# Patient Record
Sex: Female | Born: 1995 | Race: Black or African American | Hispanic: No | Marital: Single | Smoking: Current some day smoker
Health system: Southern US, Community
[De-identification: ages and names within clinical notes are randomized; demographics above are authoritative.]

## PROBLEM LIST (undated history)

## (undated) ENCOUNTER — Inpatient Hospital Stay (HOSPITAL_COMMUNITY): Payer: Self-pay

## (undated) HISTORY — PX: WISDOM TOOTH EXTRACTION: SHX21

---
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
## 2016-08-15 ENCOUNTER — Ambulatory Visit (HOSPITAL_COMMUNITY)
Admission: EM | Admit: 2016-08-15 | Discharge: 2016-08-15 | Disposition: A | Discharge status: Home / Self Care | Payer: Self-pay | Attending: Internal Medicine | Admitting: Internal Medicine

## 2016-08-15 ENCOUNTER — Encounter (HOSPITAL_COMMUNITY): Payer: Self-pay | Admitting: Emergency Medicine

## 2016-08-15 DIAGNOSIS — Z3202 Encounter for pregnancy test, result negative: Secondary | ICD-10-CM

## 2016-08-15 DIAGNOSIS — R1032 Left lower quadrant pain: Secondary | ICD-10-CM

## 2016-08-15 DIAGNOSIS — R1012 Left upper quadrant pain: Secondary | ICD-10-CM

## 2016-08-15 DIAGNOSIS — R109 Unspecified abdominal pain: Secondary | ICD-10-CM

## 2016-08-15 LAB — POCT URINALYSIS DIP (DEVICE)
BILIRUBIN URINE: NEGATIVE
Glucose, UA: NEGATIVE mg/dL
Hgb urine dipstick: NEGATIVE
Ketones, ur: NEGATIVE mg/dL
Leukocytes, UA: NEGATIVE
NITRITE: NEGATIVE
PH: 7 (ref 5.0–8.0)
PROTEIN: NEGATIVE mg/dL
Specific Gravity, Urine: 1.02 (ref 1.005–1.030)
UROBILINOGEN UA: 0.2 mg/dL (ref 0.0–1.0)

## 2016-08-15 LAB — POCT PREGNANCY, URINE: PREG TEST UR: NEGATIVE

## 2016-08-15 MED ORDER — DOCUSATE SODIUM 50 MG PO CAPS: 50.0000 mg | ORAL_CAPSULE | Freq: Two times a day (BID) | ORAL | 0 refills | Status: DC

## 2016-08-15 MED ORDER — POLYETHYLENE GLYCOL 3350 17 G PO PACK: 17.0000 g | PACK | Freq: Every day | ORAL | 0 refills | Status: DC

## 2016-08-15 NOTE — ED Triage Notes (Signed)
Pt here for constant abd pain onset 2 days that is getting worse associated w/nausea  Also reports induced vomiting to see if she would feel better.   Denies fevers  A&O x4... NAD.Marland Kitchen. ambulatory

## 2016-08-15 NOTE — Discharge Instructions (Signed)
Your urine sample was negative for blood or infection. Given your history and exam, abdominal pain can be due to constipation/stool impaction. Start Colace and Miralax. Currently there is no signs of infection, but monitor for worsening abdominal pain, fever, N/V. If symptoms are not relieved by clearing the bowel, will need reevaluation.

## 2016-08-15 NOTE — ED Provider Notes (Signed)
CSN: [id number]     Arrival date & time 08/15/16  1255 History   None    Chief Complaint  Patient presents with  . Abdominal Pain   (Consider location/radiation/quality/duration/timing/severity/associated sxs/prior Treatment) HPI 21 yo female comes in with 2 day history of constant abdominal pain with nausea. She has not been able to associate pain with food or movement. Patient states induced 1 episode of vomiting without relief. She had 1 BM recently with hard stool. Has history of hard BM. Denies fever, chills, night sweats. Denies blood in stool. Denies urinary frequency, dysuria, hematuria. Has IUD and does not get a period. Not currently sexually active. Denies vaginal discharge, pain/itching, spotting.   Past Medical History:  Diagnosis Date  . Morbid obesity (HCC)    Past Surgical History:  Procedure Laterality Date  . WISDOM TOOTH EXTRACTION     Family History  Problem Relation Age of Onset  . Hypertension Father   . Heart disease Father    Social History  Substance Use Topics  . Smoking status: Never Smoker  . Smokeless tobacco: Never Used  . Alcohol use No   OB History    Gravida Para Term Preterm AB Living   1             SAB TAB Ectopic Multiple Live Births                 Review of Systems  Constitutional: Negative for chills, diaphoresis and fever.  Respiratory: Negative for cough, shortness of breath and wheezing.   Cardiovascular: Negative for chest pain and palpitations.  Gastrointestinal: Positive for abdominal pain, constipation, nausea and vomiting (self induced). Negative for blood in stool and diarrhea.  Genitourinary: Negative for decreased urine volume, difficulty urinating, dysuria, flank pain, frequency, hematuria, menstrual problem, pelvic pain, urgency, vaginal bleeding, vaginal discharge and vaginal pain.  Skin: Negative for rash.    Allergies  Bee venom  Home Medications   Prior to Admission medications   Medication Sig Start Date End  Date Taking? Authorizing Provider  levonorgestrel (MIRENA) 20 MCG/24HR IUD 1 each by Intrauterine route once.   Yes [provider]  docusate sodium (COLACE) 50 MG capsule Take 1 capsule (50 mg total) by mouth 2 (two) times daily. 08/15/16   Belinda Fisher, PA-C  HYDROcodone-acetaminophen (NORCO/VICODIN) 5-325 MG per tablet 1 or 2 tabs PO q6 hours prn pain 09/19/13   Samuel Jester, DO  methocarbamol (ROBAXIN) 500 MG tablet Take 2 tablets (1,000 mg total) by mouth 4 (four) times daily as needed for muscle spasms (muscle spasm/pain). 09/19/13   Samuel Jester, DO  naproxen (NAPROSYN) 250 MG tablet Take 1 tablet (250 mg total) by mouth 2 (two) times daily with a meal. 09/19/13   Samuel Jester, DO  polyethylene glycol [facility]) packet Take 17 g by mouth daily. 08/15/16   Belinda Fisher, PA-C   Meds Ordered and Administered this Visit  Medications - No data to display  BP (!) 143/113 (BP Location: Right Arm)   Pulse 89   Temp 97.9 F (36.6 C) (Oral)   Resp 20   SpO2 97%  No data found.   Physical Exam  Constitutional: She is oriented to person, place, and time. She appears well-developed and well-nourished. No distress.  HENT:  Head: Normocephalic and atraumatic.  Eyes: Conjunctivae are normal. Pupils are equal, round, and reactive to light.  Cardiovascular: Normal rate and regular rhythm.  Exam reveals no gallop and no friction rub.   No  murmur heard. Pulmonary/Chest: Effort normal and breath sounds normal. She has no wheezes. She has no rales.  Abdominal: Soft. Bowel sounds are normal. She exhibits distension. There is tenderness (at LUQ and LLQ. No guarding or rebound.Marland Kitchen ).  Neurological: She is alert and oriented to person, place, and time.  Skin: Skin is warm and dry.  Psychiatric: She has a normal mood and affect. Her behavior is normal. Judgment normal.    Urgent Care Course     Procedures (including critical care time)  Labs Review Labs Reviewed  POCT URINALYSIS DIP  (DEVICE)  POCT PREGNANCY, URINE    Imaging Review No results found.      MDM   1. Abdominal pain, left lower quadrant   2. Abdominal pain, left upper quadrant    1. Discussed with patient and mother, urine sample was negative for infection or blood. Currently no signs of infection, fever, rebound. History and exam most consistent with constipation and stool impaction, discussed options of treating as constipation or to get abdominal xray. Patient would like to treat as stool impaction, and will follow up with women's health to rule out other causes. Give history and exam of acute abdomen and without history of similar symptoms, low suspicion for ovarian torsion. Patient to monitor for fever, increasing pain, vomiting not self induced to go to the ED for evaluation.    Belinda Fisher, PA-C 08/15/16 1414

## 2021-12-14 ENCOUNTER — Emergency Department (HOSPITAL_COMMUNITY)
Admission: EM | Admit: 2021-12-14 | Discharge: 2021-12-14 | Discharge status: Against Medical Advice | Payer: Medicaid Other | Attending: Emergency Medicine | Admitting: Emergency Medicine

## 2021-12-14 ENCOUNTER — Other Ambulatory Visit: Payer: Self-pay

## 2021-12-14 ENCOUNTER — Inpatient Hospital Stay (HOSPITAL_BASED_OUTPATIENT_CLINIC_OR_DEPARTMENT_OTHER)
Admission: EM | Admit: 2021-12-14 | Discharge: 2021-12-17 | DRG: 175 | Disposition: A | Discharge status: Home / Self Care | Payer: Self-pay | Attending: Family Medicine | Admitting: Family Medicine

## 2021-12-14 ENCOUNTER — Encounter (HOSPITAL_COMMUNITY): Payer: Self-pay | Admitting: *Deleted

## 2021-12-14 ENCOUNTER — Encounter (HOSPITAL_COMMUNITY): Payer: Self-pay

## 2021-12-14 ENCOUNTER — Emergency Department (HOSPITAL_BASED_OUTPATIENT_CLINIC_OR_DEPARTMENT_OTHER): Payer: Medicaid Other

## 2021-12-14 ENCOUNTER — Emergency Department (HOSPITAL_COMMUNITY): Payer: Medicaid Other

## 2021-12-14 ENCOUNTER — Encounter (HOSPITAL_BASED_OUTPATIENT_CLINIC_OR_DEPARTMENT_OTHER): Payer: Self-pay | Admitting: Emergency Medicine

## 2021-12-14 DIAGNOSIS — R059 Cough, unspecified: Secondary | ICD-10-CM | POA: Insufficient documentation

## 2021-12-14 DIAGNOSIS — Z5321 Procedure and treatment not carried out due to patient leaving prior to being seen by health care provider: Secondary | ICD-10-CM | POA: Insufficient documentation

## 2021-12-14 DIAGNOSIS — R17 Unspecified jaundice: Secondary | ICD-10-CM | POA: Diagnosis present

## 2021-12-14 DIAGNOSIS — F1210 Cannabis abuse, uncomplicated: Secondary | ICD-10-CM | POA: Diagnosis present

## 2021-12-14 DIAGNOSIS — Z79899 Other long term (current) drug therapy: Secondary | ICD-10-CM

## 2021-12-14 DIAGNOSIS — R0602 Shortness of breath: Secondary | ICD-10-CM | POA: Insufficient documentation

## 2021-12-14 DIAGNOSIS — I5020 Unspecified systolic (congestive) heart failure: Secondary | ICD-10-CM

## 2021-12-14 DIAGNOSIS — I2694 Multiple subsegmental pulmonary emboli without acute cor pulmonale: Principal | ICD-10-CM

## 2021-12-14 DIAGNOSIS — Z20822 Contact with and (suspected) exposure to covid-19: Secondary | ICD-10-CM | POA: Insufficient documentation

## 2021-12-14 DIAGNOSIS — J189 Pneumonia, unspecified organism: Secondary | ICD-10-CM | POA: Diagnosis present

## 2021-12-14 DIAGNOSIS — J029 Acute pharyngitis, unspecified: Secondary | ICD-10-CM | POA: Insufficient documentation

## 2021-12-14 DIAGNOSIS — I2489 Other forms of acute ischemic heart disease: Secondary | ICD-10-CM | POA: Diagnosis present

## 2021-12-14 DIAGNOSIS — R9431 Abnormal electrocardiogram [ECG] [EKG]: Secondary | ICD-10-CM

## 2021-12-14 DIAGNOSIS — Z975 Presence of (intrauterine) contraceptive device: Secondary | ICD-10-CM

## 2021-12-14 DIAGNOSIS — I420 Dilated cardiomyopathy: Secondary | ICD-10-CM | POA: Diagnosis present

## 2021-12-14 DIAGNOSIS — Z6841 Body Mass Index (BMI) 40.0 and over, adult: Secondary | ICD-10-CM

## 2021-12-14 DIAGNOSIS — I10 Essential (primary) hypertension: Secondary | ICD-10-CM | POA: Insufficient documentation

## 2021-12-14 DIAGNOSIS — F1810 Inhalant abuse, uncomplicated: Secondary | ICD-10-CM | POA: Diagnosis present

## 2021-12-14 DIAGNOSIS — J9601 Acute respiratory failure with hypoxia: Secondary | ICD-10-CM | POA: Diagnosis present

## 2021-12-14 DIAGNOSIS — D72828 Other elevated white blood cell count: Secondary | ICD-10-CM | POA: Diagnosis not present

## 2021-12-14 DIAGNOSIS — Z1152 Encounter for screening for COVID-19: Secondary | ICD-10-CM

## 2021-12-14 DIAGNOSIS — T380X5A Adverse effect of glucocorticoids and synthetic analogues, initial encounter: Secondary | ICD-10-CM | POA: Diagnosis not present

## 2021-12-14 DIAGNOSIS — I5022 Chronic systolic (congestive) heart failure: Secondary | ICD-10-CM | POA: Diagnosis present

## 2021-12-14 DIAGNOSIS — R0981 Nasal congestion: Secondary | ICD-10-CM | POA: Insufficient documentation

## 2021-12-14 DIAGNOSIS — E872 Acidosis, unspecified: Secondary | ICD-10-CM | POA: Diagnosis present

## 2021-12-14 DIAGNOSIS — I2699 Other pulmonary embolism without acute cor pulmonale: Principal | ICD-10-CM | POA: Diagnosis present

## 2021-12-14 DIAGNOSIS — E876 Hypokalemia: Secondary | ICD-10-CM | POA: Diagnosis present

## 2021-12-14 DIAGNOSIS — Z8249 Family history of ischemic heart disease and other diseases of the circulatory system: Secondary | ICD-10-CM

## 2021-12-14 LAB — HCG, SERUM, QUALITATIVE: Preg, Serum: NEGATIVE

## 2021-12-14 LAB — CBC WITH DIFFERENTIAL/PLATELET
Abs Immature Granulocytes: 0.04 10*3/uL (ref 0.00–0.07)
Basophils Absolute: 0 10*3/uL (ref 0.0–0.1)
Basophils Relative: 0 %
Eosinophils Absolute: 0.1 10*3/uL (ref 0.0–0.5)
Eosinophils Relative: 1 %
HCT: 42.3 % (ref 36.0–46.0)
Hemoglobin: 14 g/dL (ref 12.0–15.0)
Immature Granulocytes: 0 %
Lymphocytes Relative: 33 %
Lymphs Abs: 3.3 10*3/uL (ref 0.7–4.0)
MCH: 29.2 pg (ref 26.0–34.0)
MCHC: 33.1 g/dL (ref 30.0–36.0)
MCV: 88.1 fL (ref 80.0–100.0)
Monocytes Absolute: 0.9 10*3/uL (ref 0.1–1.0)
Monocytes Relative: 9 %
Neutro Abs: 5.7 10*3/uL (ref 1.7–7.7)
Neutrophils Relative %: 57 %
Platelets: 312 10*3/uL (ref 150–400)
RBC: 4.8 MIL/uL (ref 3.87–5.11)
RDW: 12.8 % (ref 11.5–15.5)
WBC: 10 10*3/uL (ref 4.0–10.5)
nRBC: 0 % (ref 0.0–0.2)

## 2021-12-14 LAB — COMPREHENSIVE METABOLIC PANEL
ALT: 37 U/L (ref 0–44)
AST: 31 U/L (ref 15–41)
Albumin: 3.7 g/dL (ref 3.5–5.0)
Alkaline Phosphatase: 61 U/L (ref 38–126)
Anion gap: 9 (ref 5–15)
BUN: 11 mg/dL (ref 6–20)
CO2: 21 mmol/L — ABNORMAL LOW (ref 22–32)
Calcium: 8.6 mg/dL — ABNORMAL LOW (ref 8.9–10.3)
Chloride: 108 mmol/L (ref 98–111)
Creatinine, Ser: 0.99 mg/dL (ref 0.44–1.00)
GFR, Estimated: >60 mL/min (ref >60)
Glucose, Bld: 109 mg/dL — ABNORMAL HIGH (ref 70–99)
Potassium: 3.4 mmol/L — ABNORMAL LOW (ref 3.5–5.1)
Sodium: 138 mmol/L (ref 135–145)
Total Bilirubin: 1.3 mg/dL — ABNORMAL HIGH (ref 0.3–1.2)
Total Protein: 7.3 g/dL (ref 6.5–8.1)

## 2021-12-14 LAB — RESP PANEL BY RT-PCR (FLU A&B, COVID) ARPGX2
Influenza A by PCR: NEGATIVE
Influenza B by PCR: NEGATIVE
SARS Coronavirus 2 by RT PCR: NEGATIVE

## 2021-12-14 LAB — BRAIN NATRIURETIC PEPTIDE: B Natriuretic Peptide: 257 pg/mL — ABNORMAL HIGH (ref 0.0–100.0)

## 2021-12-14 LAB — TROPONIN I (HIGH SENSITIVITY)
Troponin I (High Sensitivity): 33 ng/L — ABNORMAL HIGH (ref <18)
Troponin I (High Sensitivity): 29 ng/L — ABNORMAL HIGH (ref <18)

## 2021-12-14 LAB — GROUP A STREP BY PCR: Group A Strep by PCR: NOT DETECTED

## 2021-12-14 MED ORDER — SODIUM CHLORIDE 0.9 % IV SOLN
2.0000 g | Freq: Once | INTRAVENOUS | Status: AC
  Administered 2021-12-14: 2 g via INTRAVENOUS
  Filled 2021-12-14: qty 20

## 2021-12-14 MED ORDER — HEPARIN BOLUS VIA INFUSION
7000.0000 [IU] | Freq: Once | INTRAVENOUS | Status: AC
  Administered 2021-12-15: 7000 [IU] via INTRAVENOUS

## 2021-12-14 MED ORDER — SODIUM CHLORIDE 0.9 % IV SOLN
500.0000 mg | Freq: Once | INTRAVENOUS | Status: AC
  Administered 2021-12-14: 500 mg via INTRAVENOUS
  Filled 2021-12-14: qty 5

## 2021-12-14 MED ORDER — HEPARIN (PORCINE) 25000 UT/250ML-% IV SOLN
2000.0000 [IU]/h | INTRAVENOUS | Status: DC
  Administered 2021-12-15: 2000 [IU]/h via INTRAVENOUS
  Filled 2021-12-14: qty 250

## 2021-12-14 MED ORDER — IPRATROPIUM-ALBUTEROL 0.5-2.5 (3) MG/3ML IN SOLN
3.0000 mL | Freq: Once | RESPIRATORY_TRACT | Status: AC
  Administered 2021-12-14: 3 mL via RESPIRATORY_TRACT
  Filled 2021-12-14: qty 3

## 2021-12-14 MED ORDER — IOHEXOL 350 MG/ML SOLN
80.0000 mL | Freq: Once | INTRAVENOUS | Status: AC | PRN
  Administered 2021-12-14: 80 mL via INTRAVENOUS

## 2021-12-14 NOTE — ED Notes (Signed)
Patient placed on 2 liters O2 to decrease work of breathing. Patient oxygen saturation is 96%/RR 22. Patient tolerating well.

## 2021-12-14 NOTE — ED Provider Notes (Signed)
[facility] EMERGENCY DEPARTMENT Provider Note   CSN: [id number] Arrival date & time: 12/14/21  2024     History  Chief Complaint  Patient presents with   Shortness of Breath    Rebecca Tapia is a 26 y.o. female.  With history of morbid obesity who presents ED for evaluation of shortness of breath.  She states shortness of breath initially started 10 days ago and has progressively gotten worse.  She states 2 days after the shortness of breath started she developed a cough.  Cough was initially nonproductive, however has progressed to being productive of green sputum.  Patient is short of breath both at rest and with exercise.  She states she cannot walk up a flight of stairs without taking a break which is not normal for her.  No history of blood clots.  Patient does have an estrogen IUD.  No chest pain, dizziness, lightheadedness, hemoptysis, fevers, chills, recent immobilization or surgery, history of blood clots.   Shortness of Breath      Home Medications Prior to Admission medications   Medication Sig Start Date End Date Taking? Authorizing Provider  docusate sodium (COLACE) 50 MG capsule Take 1 capsule (50 mg total) by mouth 2 (two) times daily. 08/15/16   Belinda Fisher, PA-C  HYDROcodone-acetaminophen (NORCO/VICODIN) 5-325 MG per tablet 1 or 2 tabs PO q6 hours prn pain 09/19/13   Samuel Jester, DO  levonorgestrel [facility]) 20 MCG/24HR IUD 1 each by Intrauterine route once.    [provider]  methocarbamol (ROBAXIN) 500 MG tablet Take 2 tablets (1,000 mg total) by mouth 4 (four) times daily as needed for muscle spasms (muscle spasm/pain). 09/19/13   Samuel Jester, DO  naproxen (NAPROSYN) 250 MG tablet Take 1 tablet (250 mg total) by mouth 2 (two) times daily with a meal. 09/19/13   Samuel Jester, DO  polyethylene glycol [facility]) packet Take 17 g by mouth daily. 08/15/16   Belinda Fisher, PA-C      Allergies    Bee venom    Review of Systems   Review of  Systems  Respiratory:  Positive for shortness of breath.   All other systems reviewed and are negative.   Physical Exam Updated Vital Signs BP (!) 144/111   Pulse (!) 118   Temp 98.1 F (36.7 C) (Oral)   Resp 18   Ht 6' (1.829 m)   Wt (!) 196 kg   SpO2 97%   BMI 58.59 kg/m  Physical Exam Vitals and nursing note reviewed.  Constitutional:      General: She is not in acute distress.    Appearance: She is well-developed. She is obese. She is not ill-appearing, toxic-appearing or diaphoretic.  HENT:     Head: Normocephalic and atraumatic.  Eyes:     Conjunctiva/sclera: Conjunctivae normal.  Cardiovascular:     Rate and Rhythm: Normal rate and regular rhythm.     Pulses:          Radial pulses are 2+ on the right side and 2+ on the left side.       Dorsalis pedis pulses are 2+ on the right side and 2+ on the left side.     Heart sounds: No murmur heard. Pulmonary:     Effort: Pulmonary effort is normal. Tachypnea present. No respiratory distress.     Breath sounds: Examination of the right-upper field reveals rales. Examination of the left-upper field reveals rales. Rales present. No decreased breath sounds or wheezing.  Abdominal:     Palpations: Abdomen is soft.     Tenderness: There is no abdominal tenderness.  Musculoskeletal:        General: No swelling.     Cervical back: Neck supple.     Right lower leg: No edema.     Left lower leg: No edema.  Skin:    General: Skin is warm and dry.     Capillary Refill: Capillary refill takes less than 2 seconds.  Neurological:     General: No focal deficit present.     Mental Status: She is alert.  Psychiatric:        Mood and Affect: Mood normal.     ED Results / Procedures / Treatments   Labs (all labs ordered are listed, but only abnormal results are displayed) Labs Reviewed  COMPREHENSIVE METABOLIC PANEL - Abnormal; Notable for the following components:      Result Value   Potassium 3.4 (*)    CO2 21 (*)     Glucose, Bld 109 (*)    Calcium 8.6 (*)    Total Bilirubin 1.3 (*)    All other components within normal limits  CBC WITH DIFFERENTIAL/PLATELET  HCG, SERUM, QUALITATIVE  BRAIN NATRIURETIC PEPTIDE  TROPONIN I (HIGH SENSITIVITY)    EKG EKG Interpretation  Date/Time:  Saturday December 14 2021 21:26:03 EDT Ventricular Rate:  132 PR Interval:  117 QRS Duration: 86 QT Interval:  338 QTC Calculation: 501 R Axis:   36 Text Interpretation: Sinus tachycardia Nonspecific T abnormalities, inferior leads Confirmed by Virgina Norfolk ([id number]) on 12/14/2021 9:36:44 PM  Radiology DG Chest 2 View  Result Date: 12/14/2021 CLINICAL DATA:  Shortness of breath, cough EXAM: CHEST - 2 VIEW COMPARISON:  Prior chest x-ray 09/19/2013 FINDINGS: Enlargement of the cardiopericardial silhouette. Diffuse interstitial prominence bilaterally most suggestive of pulmonary edema. No large effusion or pneumothorax. Overall, limited evaluation secondary to patient body habitus and poor penetration. No acute osseous abnormality. IMPRESSION: 1. Cardiomegaly and diffuse interstitial airspace opacities bilaterally. Findings are favored to reflect pulmonary edema. However, acute viral or atypical respiratory infection could appear similar. 2. Very limited chest x-ray due to patient body habitus and limited penetration. Electronically Signed   By: Malachy Moan M.D.   On: 12/14/2021 07:40    Procedures Procedures    Medications Ordered in ED Medications  iohexol (OMNIPAQUE) 350 MG/ML injection 80 mL (has no administration in time range)  ipratropium-albuterol (DUONEB) 0.5-2.5 (3) MG/3ML nebulizer solution 3 mL (3 mLs Nebulization Given 12/14/21 2136)    ED Course/ Medical Decision Making/ A&P Clinical Course as of 12/14/21 2351  Sat Dec 14, 2021  2224 Reevaluation shows patient's shortness of breath coughing have improved after DuoNeb, however still present. [AS]  2344 Spoke with hospitalist Dr. Loney Loh.  Patient will  be admitted for pulmonary emboli and multifocal pneumonia. [AS]    Clinical Course User Index [AS] Zineb Glade, Edsel Petrin, PA-C                           Medical Decision Making Amount and/or Complexity of Data Reviewed Labs: ordered. Radiology: ordered.  Risk Prescription drug management.  This patient presents to the ED for concern of shortness of breath, this involves an extensive number of treatment options, and is a complaint that carries with it a high risk of complications and morbidity.  The emergent differential diagnosis for shortness of breath includes, but is not limited to, Pulmonary edema, bronchoconstriction, Pneumonia,  Pulmonary embolism, Pneumotherax/ Hemothorax, Dysrythmia, ACS.     Co morbidities that complicate the patient evaluation  Obesity, estrogen containing IUD  My initial workup includes shortness of breath labs, troponin, CT PE study, EKG, DuoNeb  Additional history obtained from: Nursing notes from this visit.  I ordered, reviewed and interpreted labs which include: CBC, CMP   I ordered imaging studies including CT PE study I independently visualized and interpreted imaging which showed difficult to visualize secondary to artifact, but suspicious for PE in bilateral lobes along with multifocal pneumonia I agree with the radiologist interpretation  Cardiac Monitoring:  The patient was maintained on a cardiac monitor.  I personally viewed and interpreted the cardiac monitored which showed an underlying rhythm of: Sinus tachycardia  Consultations Obtained:  I requested consultation with the hospitalist Dr. Marlowe Sax,  and discussed lab and imaging findings as well as pertinent plan - they will admit for treatment of pulmonary embolism pending repeat PE study as well as multifocal pneumonia  Afebrile, tachycardic, tachypneic, hypertensive.  Patient is a 26 year old female who presents the ED for evaluation of 10 days of progressing shortness of breath  along with cough.  Patient is morbidly obese, but clinically looks well.  Tachypnea did resolve after DuoNeb treatment.  Did note Rales in bilateral lungs on physical exam.  CT PE study shows signs of pulmonary emboli in bilateral lobes, however is limited secondary to artifact.  Also did show multifocal pneumonia.  Recommend repeat CT study.  Patient will be started on heparin for possible PE, azithromycin, Rocephin for multifocal pneumonia.  Will be admitted to hospitalist service.   Patient's case discussed with Dr. who agrees with plan to admit  Note: Portions of this report may have been transcribed using voice recognition software. Every effort was made to ensure accuracy; however, inadvertent computerized transcription errors may still be present.          Final Clinical Impression(s) / ED Diagnoses Final diagnoses:  None    Rx / DC Orders ED Discharge Orders     None         Roylene Reason, PA-C 12/15/21 0012    Lennice Sites, DO 12/15/21 1739

## 2021-12-14 NOTE — ED Notes (Signed)
Pt stated they were leaving. This NT seen pt leaving [facility].

## 2021-12-14 NOTE — ED Triage Notes (Addendum)
Pt c/o SHOB x  1wk; was evaluated at [facility] and [facility] today for same; sts she can't walk across the room without getting [facility]

## 2021-12-14 NOTE — ED Triage Notes (Signed)
The pt has had sopme difficulty breathing for 1-2 weeks  productive cough orange  no temp.  Lmp iud

## 2021-12-14 NOTE — Progress Notes (Signed)
ANTICOAGULATION CONSULT NOTE - Initial Consult  Pharmacy Consult for heparin Indication: suspected pulmonary embolus  Allergies  Allergen Reactions   Bee Venom Anaphylaxis    Patient Measurements: Height: 6' (182.9 cm) Weight: (!) 196 kg (432 lb) IBW/kg (Calculated) : 73.1 Heparin Dosing Weight: 125kg  Vital Signs: Temp: 98.1 F (36.7 C) (10/28 2055) Temp Source: Oral (10/28 2055) BP: 144/111 (10/28 2145) Pulse Rate: 118 (10/28 2145)  Labs: Recent Labs    12/14/21 2121  HGB 14.0  HCT 42.3  PLT 312  CREATININE 0.99  TROPONINIHS 33*    Estimated Creatinine Clearance: 166.3 mL/min (by C-G formula based on SCr of 0.99 mg/dL).   Medical History: Past Medical History:  Diagnosis Date   Morbid obesity [facility])     Assessment: 26yo female c/o SOB x10d that has progressively gotten worse, CT inconclusive but suggestive of PE >> to begin heparin.  Goal of Therapy:  Heparin level 0.3-0.7 units/ml Monitor platelets by anticoagulation protocol: Yes   Plan:  Heparin 7000 units IV bolus x1 followed by infusion at 2000 units/hr. Monitor heparin levels and CBC.  Wynona Neat, PharmD, BCPS  12/14/2021,11:19 PM

## 2021-12-14 NOTE — Plan of Care (Signed)
[facility] will assume care on arrival to accepting facility. Until arrival, care as per EDP. However, [facility] available 24/7 for questions and assistance.  Nursing staff, please page [facility] Admits and Consults ([phone]) as soon as the patient arrives to the hospital.   

## 2021-12-14 NOTE — ED Provider Notes (Incomplete)
[facility] EMERGENCY DEPARTMENT Provider Note   CSN: [id number] Arrival date & time: 12/14/21  2024     History {Add pertinent medical, surgical, social history, OB history to HPI:1} Chief Complaint  Patient presents with  . Shortness of Breath    Rebecca Tapia is a 26 y.o. female.  With history of morbid obesity who presents ED for evaluation of shortness of breath.  She states shortness of breath initially started 10 days ago and has progressively gotten worse.  She states 2 days after the shortness of breath started she developed a cough.  Cough was initially nonproductive, however has progressed to being productive of green sputum.  Patient is short of breath both at rest and with exercise.  She states she cannot walk up a flight of stairs without taking a break which is not normal for her.  No history of blood clots.  Patient does have an estrogen IUD.  No chest pain, dizziness, lightheadedness, hemoptysis, fevers, chills, recent immobilization or surgery, history of blood clots.   Shortness of Breath      Home Medications Prior to Admission medications   Medication Sig Start Date End Date Taking? Authorizing Provider  docusate sodium (COLACE) 50 MG capsule Take 1 capsule (50 mg total) by mouth 2 (two) times daily. 08/15/16   Belinda Fisher, PA-C  HYDROcodone-acetaminophen (NORCO/VICODIN) 5-325 MG per tablet 1 or 2 tabs PO q6 hours prn pain 09/19/13   Samuel Jester, DO  levonorgestrel [facility]) 20 MCG/24HR IUD 1 each by Intrauterine route once.    [provider]  methocarbamol (ROBAXIN) 500 MG tablet Take 2 tablets (1,000 mg total) by mouth 4 (four) times daily as needed for muscle spasms (muscle spasm/pain). 09/19/13   Samuel Jester, DO  naproxen (NAPROSYN) 250 MG tablet Take 1 tablet (250 mg total) by mouth 2 (two) times daily with a meal. 09/19/13   Samuel Jester, DO  polyethylene glycol [facility]) packet Take 17 g by mouth daily. 08/15/16   Belinda Fisher,  PA-C      Allergies    Bee venom    Review of Systems   Review of Systems  Respiratory:  Positive for shortness of breath.   All other systems reviewed and are negative.   Physical Exam Updated Vital Signs BP (!) 144/111   Pulse (!) 118   Temp 98.1 F (36.7 C) (Oral)   Resp 18   Ht 6' (1.829 m)   Wt (!) 196 kg   SpO2 97%   BMI 58.59 kg/m  Physical Exam Vitals and nursing note reviewed.  Constitutional:      General: She is not in acute distress.    Appearance: She is well-developed. She is obese. She is not ill-appearing, toxic-appearing or diaphoretic.  HENT:     Head: Normocephalic and atraumatic.  Eyes:     Conjunctiva/sclera: Conjunctivae normal.  Cardiovascular:     Rate and Rhythm: Normal rate and regular rhythm.     Pulses:          Radial pulses are 2+ on the right side and 2+ on the left side.       Dorsalis pedis pulses are 2+ on the right side and 2+ on the left side.     Heart sounds: No murmur heard. Pulmonary:     Effort: Pulmonary effort is normal. Tachypnea present. No respiratory distress.     Breath sounds: Examination of the right-upper field reveals rales. Examination of the left-upper field reveals rales.  Rales present. No decreased breath sounds or wheezing.  Abdominal:     Palpations: Abdomen is soft.     Tenderness: There is no abdominal tenderness.  Musculoskeletal:        General: No swelling.     Cervical back: Neck supple.     Right lower leg: No edema.     Left lower leg: No edema.  Skin:    General: Skin is warm and dry.     Capillary Refill: Capillary refill takes less than 2 seconds.  Neurological:     General: No focal deficit present.     Mental Status: She is alert.  Psychiatric:        Mood and Affect: Mood normal.     ED Results / Procedures / Treatments   Labs (all labs ordered are listed, but only abnormal results are displayed) Labs Reviewed  COMPREHENSIVE METABOLIC PANEL - Abnormal; Notable for the following  components:      Result Value   Potassium 3.4 (*)    CO2 21 (*)    Glucose, Bld 109 (*)    Calcium 8.6 (*)    Total Bilirubin 1.3 (*)    All other components within normal limits  CBC WITH DIFFERENTIAL/PLATELET  HCG, SERUM, QUALITATIVE  BRAIN NATRIURETIC PEPTIDE  TROPONIN I (HIGH SENSITIVITY)    EKG EKG Interpretation  Date/Time:  Saturday December 14 2021 21:26:03 EDT Ventricular Rate:  132 PR Interval:  117 QRS Duration: 86 QT Interval:  338 QTC Calculation: 501 R Axis:   36 Text Interpretation: Sinus tachycardia Nonspecific T abnormalities, inferior leads Confirmed by Lennice Sites ([id number]) on 12/14/2021 9:36:44 PM  Radiology DG Chest 2 View  Result Date: 12/14/2021 CLINICAL DATA:  Shortness of breath, cough EXAM: CHEST - 2 VIEW COMPARISON:  Prior chest x-ray 09/19/2013 FINDINGS: Enlargement of the cardiopericardial silhouette. Diffuse interstitial prominence bilaterally most suggestive of pulmonary edema. No large effusion or pneumothorax. Overall, limited evaluation secondary to patient body habitus and poor penetration. No acute osseous abnormality. IMPRESSION: 1. Cardiomegaly and diffuse interstitial airspace opacities bilaterally. Findings are favored to reflect pulmonary edema. However, acute viral or atypical respiratory infection could appear similar. 2. Very limited chest x-ray due to patient body habitus and limited penetration. Electronically Signed   By: Jacqulynn Cadet M.D.   On: 12/14/2021 07:40    Procedures Procedures  {Document cardiac monitor, telemetry assessment procedure when appropriate:1}  Medications Ordered in ED Medications  iohexol (OMNIPAQUE) 350 MG/ML injection 80 mL (has no administration in time range)  ipratropium-albuterol (DUONEB) 0.5-2.5 (3) MG/3ML nebulizer solution 3 mL (3 mLs Nebulization Given 12/14/21 2136)    ED Course/ Medical Decision Making/ A&P Clinical Course as of 12/14/21 2351  Sat Dec 14, 2021  2224 Reevaluation shows  patient's shortness of breath coughing have improved after DuoNeb, however still present. [AS]  2344 Spoke with hospitalist Dr. Marlowe Sax.  Patient will be admitted for pulmonary emboli and multifocal pneumonia. [AS]    Clinical Course User Index [AS] Kishan Wachsmuth, [place] Folk, PA-C                           Medical Decision Making Amount and/or Complexity of Data Reviewed Labs: ordered. Radiology: ordered.  Risk Prescription drug management.  This patient presents to the ED for concern of shortness of breath, this involves an extensive number of treatment options, and is a complaint that carries with it a high risk of complications and morbidity.  The emergent  differential diagnosis for shortness of breath includes, but is not limited to, Pulmonary edema, bronchoconstriction, Pneumonia, Pulmonary embolism, Pneumotherax/ Hemothorax, Dysrythmia, ACS.     Co morbidities that complicate the patient evaluation  .Obesity, estrogen containing IUD  My initial workup includes shortness of breath labs, troponin, CT PE study, EKG, DuoNeb  Additional history obtained from: Nursing notes from this visit.  I ordered, reviewed and interpreted labs which include: CBC, CMP   I ordered imaging studies including *** I independently visualized and interpreted imaging which showed *** I agree with the radiologist interpretation  Cardiac Monitoring:  .The patient was maintained on a cardiac monitor.  I personally viewed and interpreted the cardiac monitored which showed an underlying rhythm of: ***  Consultations Obtained:  I requested consultation with the ***,  and discussed lab and imaging findings as well as pertinent plan - they recommend: ***  Final decision   At this time there does not appear to be any evidence of an acute emergency medical condition and the patient appears stable for discharge with appropriate outpatient follow up. Diagnosis was discussed with patient who verbalizes  understanding of care plan and is agreeable to discharge. I have discussed return precautions with patient and *** who verbalizes understanding. Patient encouraged to follow-up with their PCP within ***. All questions answered.  Patient's case discussed with Dr. Marland Kitchen who agrees with plan to discharge with follow-up.   Note: Portions of this report may have been transcribed using voice recognition software. Every effort was made to ensure accuracy; however, inadvertent computerized transcription errors may still be present.  ***  {Document critical care time when appropriate:1} {Document review of labs and clinical decision tools ie heart score, Chads2Vasc2 etc:1}  {Document your independent review of radiology images, and any outside records:1} {Document your discussion with family members, caretakers, and with consultants:1} {Document social determinants of health affecting pt's care:1} {Document your decision making why or why not admission, treatments were needed:1} Final Clinical Impression(s) / ED Diagnoses Final diagnoses:  None    Rx / DC Orders ED Discharge Orders     None

## 2021-12-14 NOTE — ED Provider Triage Note (Signed)
Emergency Medicine Provider Triage Evaluation Note  Rebecca Tapia , a 26 y.o. female  was evaluated in triage.  Pt complains of cough, shortness of breath and sore throat.  Began a few days ago.  No fever.  No lower extremity swelling.  No history of PE or DVT.  No pain in chest  Significantly hypertensive here  Review of Systems  Positive: Cough, sore throat, sob Negative: fever  Physical Exam  BP (!) 181/138   Pulse 63   Temp 98.3 F (36.8 C) (Oral)   Resp (!) 24   SpO2 92%  Gen:   Awake, no distress   Resp:  Normal effort  MSK:   Moves extremities without difficulty, No LE swelling Other:    Medical Decision Making  Medically screening exam initiated at 12:36 AM.  Appropriate orders placed.  Rebecca Tapia was informed that the remainder of the evaluation will be completed by another provider, this initial triage assessment does not replace that evaluation, and the importance of remaining in the ED until their evaluation is complete.  Cough, SOB, sore throat   Terel Bann A, PA-C 12/14/21 0038

## 2021-12-14 NOTE — ED Triage Notes (Addendum)
Patient has had a cough/congestion for a week. Was coughing up clear that turned to orange. She feels like there is an egg in her throat. Not able to sleep. [facility], got a strep/covid/flu swab, xray and EKG but she left before getting her results.

## 2021-12-15 ENCOUNTER — Observation Stay (HOSPITAL_COMMUNITY): Payer: Medicaid Other

## 2021-12-15 ENCOUNTER — Observation Stay (HOSPITAL_COMMUNITY): Payer: Self-pay

## 2021-12-15 DIAGNOSIS — J189 Pneumonia, unspecified organism: Secondary | ICD-10-CM

## 2021-12-15 DIAGNOSIS — I2609 Other pulmonary embolism with acute cor pulmonale: Secondary | ICD-10-CM

## 2021-12-15 DIAGNOSIS — M7989 Other specified soft tissue disorders: Secondary | ICD-10-CM

## 2021-12-15 LAB — D-DIMER, QUANTITATIVE: D-Dimer, Quant: 1.63 ug/mL-FEU — ABNORMAL HIGH (ref 0.00–0.50)

## 2021-12-15 LAB — CBC WITH DIFFERENTIAL/PLATELET
Abs Immature Granulocytes: 0.04 10*3/uL (ref 0.00–0.07)
Basophils Absolute: 0 10*3/uL (ref 0.0–0.1)
Basophils Relative: 0 %
Eosinophils Absolute: 0 10*3/uL (ref 0.0–0.5)
Eosinophils Relative: 0 %
HCT: 44.4 % (ref 36.0–46.0)
Hemoglobin: 14.6 g/dL (ref 12.0–15.0)
Immature Granulocytes: 1 %
Lymphocytes Relative: 7 %
Lymphs Abs: 0.5 10*3/uL — ABNORMAL LOW (ref 0.7–4.0)
MCH: 29.3 pg (ref 26.0–34.0)
MCHC: 32.9 g/dL (ref 30.0–36.0)
MCV: 89.2 fL (ref 80.0–100.0)
Monocytes Absolute: 0.4 10*3/uL (ref 0.1–1.0)
Monocytes Relative: 5 %
Neutro Abs: 6.8 10*3/uL (ref 1.7–7.7)
Neutrophils Relative %: 87 %
Platelets: 320 10*3/uL (ref 150–400)
RBC: 4.98 MIL/uL (ref 3.87–5.11)
RDW: 12.9 % (ref 11.5–15.5)
WBC: 7.8 10*3/uL (ref 4.0–10.5)
nRBC: 0 % (ref 0.0–0.2)

## 2021-12-15 LAB — COMPREHENSIVE METABOLIC PANEL
ALT: 52 U/L — ABNORMAL HIGH (ref 0–44)
AST: 41 U/L (ref 15–41)
Albumin: 3.8 g/dL (ref 3.5–5.0)
Alkaline Phosphatase: 66 U/L (ref 38–126)
Anion gap: 8 (ref 5–15)
BUN: 11 mg/dL (ref 6–20)
CO2: 21 mmol/L — ABNORMAL LOW (ref 22–32)
Calcium: 8.9 mg/dL (ref 8.9–10.3)
Chloride: 106 mmol/L (ref 98–111)
Creatinine, Ser: 0.82 mg/dL (ref 0.44–1.00)
GFR, Estimated: >60 mL/min (ref >60)
Glucose, Bld: 122 mg/dL — ABNORMAL HIGH (ref 70–99)
Potassium: 3.8 mmol/L (ref 3.5–5.1)
Sodium: 135 mmol/L (ref 135–145)
Total Bilirubin: 1.2 mg/dL (ref 0.3–1.2)
Total Protein: 8.2 g/dL — ABNORMAL HIGH (ref 6.5–8.1)

## 2021-12-15 LAB — ECHOCARDIOGRAM COMPLETE
Calc EF: 34.8 %
Height: 72 in
MV M vel: 4.76 m/s
MV Peak grad: 90.6 mmHg
S' Lateral: 6 cm
Single Plane A2C EF: 31.3 %
Single Plane A4C EF: 38.9 %
Weight: 6912 oz

## 2021-12-15 LAB — HEPARIN LEVEL (UNFRACTIONATED)
Heparin Unfractionated: 0.16 IU/mL — ABNORMAL LOW (ref 0.30–0.70)
Heparin Unfractionated: 0.34 IU/mL (ref 0.30–0.70)

## 2021-12-15 LAB — MAGNESIUM: Magnesium: 2.2 mg/dL (ref 1.7–2.4)

## 2021-12-15 LAB — PHOSPHORUS: Phosphorus: 4.2 mg/dL (ref 2.5–4.6)

## 2021-12-15 LAB — PROCALCITONIN: Procalcitonin: <0.1 ng/mL

## 2021-12-15 MED ORDER — IPRATROPIUM-ALBUTEROL 0.5-2.5 (3) MG/3ML IN SOLN
3.0000 mL | RESPIRATORY_TRACT | Status: DC
  Administered 2021-12-15 (×3): 3 mL via RESPIRATORY_TRACT
  Filled 2021-12-15 (×3): qty 3

## 2021-12-15 MED ORDER — SODIUM CHLORIDE 0.9 % IV SOLN: 500.0000 mg | INTRAVENOUS | Status: DC

## 2021-12-15 MED ORDER — AZITHROMYCIN 250 MG PO TABS
500.0000 mg | ORAL_TABLET | Freq: Every day | ORAL | Status: DC
  Administered 2021-12-15 – 2021-12-16 (×2): 500 mg via ORAL
  Filled 2021-12-15 (×2): qty 2

## 2021-12-15 MED ORDER — MELATONIN 5 MG PO TABS
5.0000 mg | ORAL_TABLET | Freq: Every evening | ORAL | Status: DC | PRN
  Administered 2021-12-15: 5 mg via ORAL
  Filled 2021-12-15 (×2): qty 1

## 2021-12-15 MED ORDER — ACETAMINOPHEN 325 MG PO TABS
650.0000 mg | ORAL_TABLET | Freq: Four times a day (QID) | ORAL | Status: DC | PRN
  Administered 2021-12-15: 650 mg via ORAL
  Filled 2021-12-15 (×2): qty 2

## 2021-12-15 MED ORDER — METHYLPREDNISOLONE SODIUM SUCC 40 MG IJ SOLR
40.0000 mg | INTRAMUSCULAR | Status: AC
  Administered 2021-12-15 – 2021-12-17 (×3): 40 mg via INTRAVENOUS
  Filled 2021-12-15 (×3): qty 1

## 2021-12-15 MED ORDER — HYDRALAZINE HCL 20 MG/ML IJ SOLN: 10.0000 mg | Freq: Four times a day (QID) | INTRAMUSCULAR | Status: DC | PRN

## 2021-12-15 MED ORDER — PERFLUTREN LIPID MICROSPHERE
1.0000 mL | INTRAVENOUS | Status: AC | PRN
  Administered 2021-12-15: 2 mL via INTRAVENOUS

## 2021-12-15 MED ORDER — POTASSIUM CHLORIDE CRYS ER 20 MEQ PO TBCR
40.0000 meq | EXTENDED_RELEASE_TABLET | Freq: Once | ORAL | Status: AC
  Administered 2021-12-15: 40 meq via ORAL
  Filled 2021-12-15: qty 2

## 2021-12-15 MED ORDER — IPRATROPIUM-ALBUTEROL 0.5-2.5 (3) MG/3ML IN SOLN
3.0000 mL | Freq: Four times a day (QID) | RESPIRATORY_TRACT | Status: DC
  Administered 2021-12-15 – 2021-12-17 (×8): 3 mL via RESPIRATORY_TRACT
  Filled 2021-12-15 (×8): qty 3

## 2021-12-15 MED ORDER — HYDRALAZINE HCL 25 MG PO TABS: 25.0000 mg | ORAL_TABLET | Freq: Four times a day (QID) | ORAL | Status: DC | PRN

## 2021-12-15 MED ORDER — IBUPROFEN 200 MG PO TABS
600.0000 mg | ORAL_TABLET | Freq: Once | ORAL | Status: AC
  Administered 2021-12-15: 600 mg via ORAL
  Filled 2021-12-15: qty 3

## 2021-12-15 MED ORDER — HEPARIN (PORCINE) 25000 UT/250ML-% IV SOLN
2500.0000 [IU]/h | INTRAVENOUS | Status: DC
  Administered 2021-12-15 – 2021-12-17 (×5): 2500 [IU]/h via INTRAVENOUS
  Filled 2021-12-15 (×6): qty 250

## 2021-12-15 MED ORDER — HEPARIN BOLUS VIA INFUSION
4000.0000 [IU] | Freq: Once | INTRAVENOUS | Status: AC
  Administered 2021-12-15: 4000 [IU] via INTRAVENOUS
  Filled 2021-12-15: qty 4000

## 2021-12-15 MED ORDER — POLYETHYLENE GLYCOL 3350 17 G PO PACK: 17.0000 g | PACK | Freq: Every day | ORAL | Status: DC | PRN

## 2021-12-15 MED ORDER — SODIUM CHLORIDE 0.9 % IV SOLN
2.0000 g | INTRAVENOUS | Status: DC
  Administered 2021-12-15 – 2021-12-16 (×2): 2 g via INTRAVENOUS
  Filled 2021-12-15 (×2): qty 20

## 2021-12-15 MED ORDER — CARVEDILOL 12.5 MG PO TABS
12.5000 mg | ORAL_TABLET | Freq: Two times a day (BID) | ORAL | Status: DC
  Administered 2021-12-15 – 2021-12-17 (×4): 12.5 mg via ORAL
  Filled 2021-12-15 (×3): qty 1

## 2021-12-15 MED ORDER — IPRATROPIUM-ALBUTEROL 0.5-2.5 (3) MG/3ML IN SOLN
3.0000 mL | Freq: Once | RESPIRATORY_TRACT | Status: AC
  Administered 2021-12-15: 3 mL via RESPIRATORY_TRACT
  Filled 2021-12-15: qty 3

## 2021-12-15 MED ORDER — PROCHLORPERAZINE EDISYLATE 10 MG/2ML IJ SOLN: 5.0000 mg | Freq: Four times a day (QID) | INTRAMUSCULAR | Status: DC | PRN

## 2021-12-15 NOTE — Progress Notes (Signed)
Bilateral lower extremity venous duplex has been completed. Preliminary results can be found in CV Proc through chart review.   12/15/21 10:53 AM Rebecca Tapia RVT
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
## 2021-12-15 NOTE — Progress Notes (Signed)
ANTICOAGULATION CONSULT NOTE Pharmacy Consult for heparin Indication: suspected pulmonary embolus  Allergies  Allergen Reactions   Bee Venom Anaphylaxis    Patient Measurements: Height: 6' (182.9 cm) Weight: (!) 196 kg (432 lb) IBW/kg (Calculated) : 73.1 Heparin Dosing Weight: 125kg  Vital Signs: Temp: 98.1 F (36.7 C) (10/29 0536) Temp Source: Oral (10/29 0536) BP: 132/72 (10/29 0600) Pulse Rate: 117 (10/29 0624)  Labs: Recent Labs    12/14/21 2121 12/14/21 2318 12/15/21 0645 12/15/21 1426  HGB 14.0  --  14.6  --   HCT 42.3  --  44.4  --   PLT 312  --  320  --   HEPARINUNFRC  --   --  0.16* 0.34  CREATININE 0.99  --  0.82  --   TROPONINIHS 33* 29*  --   --      Estimated Creatinine Clearance: 200.7 mL/min (by C-G formula based on SCr of 0.82 mg/dL).   Medical History: Past Medical History:  Diagnosis Date   Morbid obesity [facility])     Assessment: 26yo female c/o SOB x10d that has progressively gotten worse, CT inconclusive but suggestive of PE >> to begin heparin per pharmacy  12/15/2021 First heparin level 0.16 - subtherapeutic after 7000 unit bolus and heparin drip @ 2000 units/hr.  2nd heparin level 0.34 - therapeutic after 4000 units bolus and heparin rate increased to 2500 units/hr CBC remains WNL, stable SCr WNL. No bleeding reported B dopplers neg for DVT but limited by body habitus Echo done- results pending VQ ordered  Goal of Therapy:  Heparin level 0.3-0.7 units/ml Monitor platelets by anticoagulation protocol: Yes   Plan:  Continue heparin drip @ 2500 units/hr Daily heparin level & CBC F/u VQ  F/u for transition to oral anticoagulant if needed  Eudelia Bunch, Pharm.D 12/15/2021 4:58 PM

## 2021-12-15 NOTE — Hospital Course (Signed)
Rebecca Tapia is a 26 y.o. female with a history of morbid obesity and marijuana use. Patient is on birth control with Mirena IUD in place. Patient presented secondary to progressive shortness of breath with associated non-productive cough. CTA imaging suggested possible PE and was concerning for multifocal pneumonia. Patient started on empiric antibiotics and Heparin IV in addition to supplemental oxygen for associated hypoxia and Solu-medrol/Duoneb for some wheezing. Lower extremity venous duplex was negative for acute DVT. Transthoracic Echocardiogram obtained and did not show evidence of RV strain, but was significant for newly diagnosed dilated cardiomyopathy. Cardiology consulted.

## 2021-12-15 NOTE — Progress Notes (Signed)
Echocardiogram 2D Echocardiogram has been performed.  Fidel Levy 12/15/2021, 1:21 PM

## 2021-12-15 NOTE — Progress Notes (Signed)
ANTICOAGULATION CONSULT NOTE Pharmacy Consult for heparin Indication: suspected pulmonary embolus  Allergies  Allergen Reactions   Bee Venom Anaphylaxis    Patient Measurements: Height: 6' (182.9 cm) Weight: (!) 196 kg (432 lb) IBW/kg (Calculated) : 73.1 Heparin Dosing Weight: 125kg  Vital Signs: Temp: 98.1 F (36.7 C) (10/29 0536) Temp Source: Oral (10/29 0536) BP: 132/72 (10/29 0600) Pulse Rate: 117 (10/29 0624)  Labs: Recent Labs    12/14/21 2121 12/14/21 2318 12/15/21 0645  HGB 14.0  --  14.6  HCT 42.3  --  44.4  PLT 312  --  320  HEPARINUNFRC  --   --  0.16*  CREATININE 0.99  --  0.82  TROPONINIHS 33* 29*  --      Estimated Creatinine Clearance: 200.7 mL/min (by C-G formula based on SCr of 0.82 mg/dL).   Medical History: Past Medical History:  Diagnosis Date   Morbid obesity [facility])     Assessment: 26yo female c/o SOB x10d that has progressively gotten worse, CT inconclusive but suggestive of PE >> to begin heparin per pharmacy  12/15/2021 First heparin level 0.16 - subtherapeutic after 7000 unit bolus and heparin drip @ 2000 units/hr.  CBC remains WNL, stable SCr WNL. No bleeding reported  Goal of Therapy:  Heparin level 0.3-0.7 units/ml Monitor platelets by anticoagulation protocol: Yes   Plan:  Heparin 4000 unit bolus Increase heparin drip to 2500 units/hr Check 6 hour heparin level Daily heparin level & CBC F/u for transition to oral anticoagulant  Eudelia Bunch, Pharm.D 12/15/2021 8:38 AM

## 2021-12-15 NOTE — Progress Notes (Signed)
PROGRESS NOTE    Rebecca Tapia  KKX:[id number] DOB: 27-Apr-1995 DOA: 12/14/2021 PCP: Patient, No Pcp Per   Brief Narrative: Rebecca Tapia is a 26 y.o. female with a history of morbid obesity and marijuana use. Patient is on birth control with Mirena IUD in place. Patient presented secondary to progressive shortness of breath with associated non-productive cough. CTA imaging suggested possible PE and was concerning for multifocal pneumonia. Patient started on empiric antibiotics and Heparin IV in addition to supplemental oxygen for associated hypoxia and Solu-medrol/Duoneb for some wheezing. Lower extremity venous duplex was negative for acute DVT.    Assessment and Plan:  Acute respiratory failure with hypoxia Secondary to pneumonia in addition to possible PE. Patient requiring 2 L/min of supplemental oxygen. Patient also started on Solu-medrol and Duoneb nebulizer treatments. -Continue Solu-medrol/Duoneb for now  Multifocal pneumonia Noted on CTA chest. Recently negative COVID-19 and influenza testing. No URI symptoms. Associated hypoxia and dyspnea on exertion. Patient started empirically on Ceftriaxone and azithromycin. No procalcitonin available. No leukocytosis. -RVP -Procalcitonin -Continue Ceftriaxone and azithromycin  Elevated troponin Mildly elevated. Likely secondary to demand ischemia. No chest pain. EKG non-ischemic. Transthoracic Echocardiogram is pending to rule out heart strain.  Possible pulmonary embolism Possible filling defect vs artifact on CTA chest. No chest pain. Tachycardia. No dyspnea at rest. Patient is on progestin only IUD. Patient started empirically on Heparin IV. -Obtain V/Q scan -Continue Heparin IV for now -Follow-up Transthoracic Echocardiogram  Prolonged QTc -Repeat EKG  Hypokalemia Mild with potassium of 3.4. Resolved.  Non-anion gap metabolic acidosis Mild  Hyperbilirubinemia Minimally elevated. Trended down. Resolved.  Morbid  obesity Estimated body mass index is 58.59 kg/m as calculated from the following:   Height as of this encounter: 6' (1.829 m).   Weight as of this encounter: 196 kg.   DVT prophylaxis: Heparin IV Code Status:   Code Status: Full Code Family Communication: Mother and sister at bedside Disposition Plan: Discharge home likely in 1 day if able to wean oxygen to room air in addition to awaiting V/Q scan results   Consultants:  None  Procedures:  10/29: LE venous duplex  Antimicrobials: Ceftriaxone Azithromycin    Subjective: Patient reports improvement in dyspnea. No chest pain. Cough present. Some discomfort with deep breaths which has improved.  Objective: BP 132/72 (BP Location: Left Arm)   Pulse (!) 117   Temp 98.1 F (36.7 C) (Oral)   Resp 18   Ht 6' (1.829 m)   Wt (!) 196 kg   SpO2 97%   BMI 58.59 kg/m   Examination:  General exam: Appears calm and comfortable Respiratory system: Clear to auscultation. Respiratory effort normal. Cardiovascular system: S1 & S2 heard. Tachycardia with normal rhythm. No murmurs, rubs, gallops or clicks. Gastrointestinal system: Abdomen is nondistended, soft and nontender. No organomegaly or masses felt. Normal bowel sounds heard. Central nervous system: Alert and oriented. No focal neurological deficits. Musculoskeletal: No edema. No calf tenderness Skin: No cyanosis. No rashes Psychiatry: Judgement and insight appear normal. Mood & affect appropriate.    Data Reviewed: I have personally reviewed following labs and imaging studies  CBC Lab Results  Component Value Date   WBC 7.8 12/15/2021   RBC 4.98 12/15/2021   HGB 14.6 12/15/2021   HCT 44.4 12/15/2021   MCV 89.2 12/15/2021   MCH 29.3 12/15/2021   PLT 320 12/15/2021   MCHC 32.9 12/15/2021   RDW 12.9 12/15/2021   LYMPHSABS 0.5 (L) 12/15/2021   MONOABS 0.4 12/15/2021  EOSABS 0.0 12/15/2021   BASOSABS 0.0 12/15/2021     Last metabolic panel Lab Results   Component Value Date   NA 135 12/15/2021   K 3.8 12/15/2021   CL 106 12/15/2021   CO2 21 (L) 12/15/2021   BUN 11 12/15/2021   CREATININE 0.82 12/15/2021   GLUCOSE 122 (H) 12/15/2021   GFRNONAA >60 12/15/2021   CALCIUM 8.9 12/15/2021   PHOS 4.2 12/15/2021   PROT 8.2 (H) 12/15/2021   ALBUMIN 3.8 12/15/2021   BILITOT 1.2 12/15/2021   ALKPHOS 66 12/15/2021   AST 41 12/15/2021   ALT 52 (H) 12/15/2021   ANIONGAP 8 12/15/2021    GFR: Estimated Creatinine Clearance: 200.7 mL/min (by C-G formula based on SCr of 0.82 mg/dL).  Recent Results (from the past 240 hour(s))  Group A Strep by PCR     Status: None   Collection Time: 12/14/21 12:48 AM   Specimen: Anterior Nasal Swab; Sterile Swab  Result Value Ref Range Status   Group A Strep by PCR NOT DETECTED NOT DETECTED Final    Comment: Performed at [facility], [address] [place], [place] [address]  Resp Panel by RT-PCR (Flu A&B, Covid) Anterior Nasal Swab     Status: None   Collection Time: 12/14/21 12:49 AM   Specimen: Anterior Nasal Swab  Result Value Ref Range Status   SARS Coronavirus 2 by RT PCR NEGATIVE NEGATIVE Final    Comment: (NOTE) SARS-CoV-2 target nucleic acids are NOT DETECTED.  The SARS-CoV-2 RNA is generally detectable in upper respiratory specimens during the acute phase of infection. The lowest concentration of SARS-CoV-2 viral copies this assay can detect is 138 copies/mL. A negative result does not preclude SARS-Cov-2 infection and should not be used as the sole basis for treatment or other patient management decisions. A negative result may occur with  improper specimen collection/handling, submission of specimen other than nasopharyngeal swab, presence of viral mutation(s) within the areas targeted by this assay, and inadequate number of viral copies(<138 copies/mL). A negative result must be combined with clinical observations, patient history, and epidemiological information. The expected  result is Negative.  Fact Sheet for Patients:  BloggerCourse.com  Fact Sheet for Healthcare Providers:  SeriousBroker.it  This test is no t yet approved or cleared by the [place] FDA and  has been authorized for detection and/or diagnosis of SARS-CoV-2 by FDA under an Emergency Use Authorization (EUA). This EUA will remain  in effect (meaning this test can be used) for the duration of the COVID-19 declaration under Section 564(b)(1) of the Act, 21 U.S.C.section 360bbb-3(b)(1), unless the authorization is terminated  or revoked sooner.       Influenza A by PCR NEGATIVE NEGATIVE Final   Influenza B by PCR NEGATIVE NEGATIVE Final    Comment: (NOTE) The Xpert Xpress SARS-CoV-2/FLU/RSV plus assay is intended as an aid in the diagnosis of influenza from Nasopharyngeal swab specimens and should not be used as a sole basis for treatment. Nasal washings and aspirates are unacceptable for Xpert Xpress SARS-CoV-2/FLU/RSV testing.  Fact Sheet for Patients: BloggerCourse.com  Fact Sheet for Healthcare Providers: SeriousBroker.it  This test is not yet approved or cleared by the [place] FDA and has been authorized for detection and/or diagnosis of SARS-CoV-2 by FDA under an Emergency Use Authorization (EUA). This EUA will remain in effect (meaning this test can be used) for the duration of the COVID-19 declaration under Section 564(b)(1) of the Act, 21 U.S.C. section 360bbb-3(b)(1), unless the  authorization is terminated or revoked.  Performed at [facility], [place] [address] [place], [place] [address]       Radiology Studies: VAS [place] LOWER EXTREMITY VENOUS (DVT)  Result Date: 12/15/2021  Lower Venous DVT Study Patient Name:  MAKINZEY BANES  Date of Exam:   12/15/2021 Medical Rec #: [id number]         Accession #:    [id number] Date of Birth: 1996/02/01           Patient Gender: F Patient Age:   22 years Exam Location:  [facility] Procedure:      VAS [place] LOWER EXTREMITY VENOUS (DVT) Referring Phys: Archie Patten HALL --------------------------------------------------------------------------------  Indications: Swelling.  Risk Factors: None identified. Limitations: Body habitus and poor ultrasound/tissue interface. Comparison Study: No prior studies. Performing Technologist: Oliver Hum RVT  Examination Guidelines: A complete evaluation includes B-mode imaging, spectral Doppler, color Doppler, and power Doppler as needed of all accessible portions of each vessel. Bilateral testing is considered an integral part of a complete examination. Limited examinations for reoccurring indications may be performed as noted. The reflux portion of the exam is performed with the patient in reverse Trendelenburg.  +---------+---------------+---------+-----------+----------+--------------+ RIGHT    CompressibilityPhasicitySpontaneityPropertiesThrombus Aging +---------+---------------+---------+-----------+----------+--------------+ CFV      Full           Yes      Yes                                 +---------+---------------+---------+-----------+----------+--------------+ SFJ      Full                                                        +---------+---------------+---------+-----------+----------+--------------+ FV Prox  Full                                                        +---------+---------------+---------+-----------+----------+--------------+ FV Mid   Full                                                        +---------+---------------+---------+-----------+----------+--------------+ FV Distal               Yes      Yes                                 +---------+---------------+---------+-----------+----------+--------------+ PFV      Full                                                         +---------+---------------+---------+-----------+----------+--------------+ POP      Full           Yes      Yes                                 +---------+---------------+---------+-----------+----------+--------------+  PTV      Full                                                        +---------+---------------+---------+-----------+----------+--------------+ PERO     Full                                                        +---------+---------------+---------+-----------+----------+--------------+   +---------+---------------+---------+-----------+----------+--------------+ LEFT     CompressibilityPhasicitySpontaneityPropertiesThrombus Aging +---------+---------------+---------+-----------+----------+--------------+ CFV      Full           Yes      Yes                                 +---------+---------------+---------+-----------+----------+--------------+ SFJ      Full                                                        +---------+---------------+---------+-----------+----------+--------------+ FV Prox  Full                                                        +---------+---------------+---------+-----------+----------+--------------+ FV Mid   Full                                                        +---------+---------------+---------+-----------+----------+--------------+ FV Distal               Yes      Yes                                 +---------+---------------+---------+-----------+----------+--------------+ PFV      Full                                                        +---------+---------------+---------+-----------+----------+--------------+ POP      Full           Yes      Yes                                 +---------+---------------+---------+-----------+----------+--------------+ PTV      Full                                                         +---------+---------------+---------+-----------+----------+--------------+  PERO     Full                                                        +---------+---------------+---------+-----------+----------+--------------+    Summary: RIGHT: - There is no evidence of deep vein thrombosis in the lower extremity. However, portions of this examination were limited- see technologist comments above.  - No cystic structure found in the popliteal fossa.  LEFT: - There is no evidence of deep vein thrombosis in the lower extremity. However, portions of this examination were limited- see technologist comments above.  - No cystic structure found in the popliteal fossa.  *See table(s) above for measurements and observations.    Preliminary    CT Angio Chest PE W and/or Wo Contrast  Result Date: 12/14/2021 CLINICAL DATA:  Pulmonary embolism suspected, high probability. Shortness of breath for 1 week. EXAM: CT ANGIOGRAPHY CHEST WITH CONTRAST TECHNIQUE: Multidetector CT imaging of the chest was performed using the standard protocol during bolus administration of intravenous contrast. Multiplanar CT image reconstructions and MIPs were obtained to evaluate the vascular anatomy. RADIATION DOSE REDUCTION: This exam was performed according to the departmental dose-optimization program which includes automated exposure control, adjustment of the mA and/or kV according to patient size and/or use of iterative reconstruction technique. CONTRAST:  45mL OMNIPAQUE IOHEXOL 350 MG/ML SOLN COMPARISON:  None Available. FINDINGS: Cardiovascular: The heart is enlarged and there is no pericardial effusion. The aorta is normal in caliber. The pulmonary trunk is distended which may be associated with underlying pulmonary artery hypertension. Examination of the pulmonary arteries is extremely limited due to patient's body habitus and respiratory motion artifact. There is a possible pulmonary embolus in the left lower lobe lobar, segmental and  subsegmental arteries versus artifact. Evaluation of the main right pulmonary artery is limited due to artifact. Mediastinum/Nodes: There suspected prominent lymph nodes in the mediastinum measuring up to 1 cm. Prominent lymph nodes are noted at the right hilum. No axillary lymphadenopathy. The thyroid gland, trachea, and esophagus are within normal limits. Lungs/Pleura: Multifocal airspace opacities are noted in the lungs bilaterally no effusion or pneumothorax. Upper Abdomen: No acute abnormality. Musculoskeletal: No acute osseous abnormality. Review of the MIP images confirms the above findings. IMPRESSION: 1. Examination of the pulmonary arteries is significantly limited due to respiratory motion artifact and patient's body habitus. There is a possible left lower lobe lobar, and segmental pulmonary artery filling defect versus artifact. Significant artifact is also noted in the region of the right main pulmonary artery in the possibility of underlying embolus can not be excluded. Consider short-term repeat follow-up exam or V/Q scan. 2. Scattered airspace opacities in the lungs bilaterally, suspicious for multifocal pneumonia. 3. Distended pulmonary trunk suggesting underlying pulmonary artery hypertension. 4. Cardiomegaly. Electronically Signed   By: Thornell Sartorius M.D.   On: 12/14/2021 22:46   DG Chest 2 View  Result Date: 12/14/2021 CLINICAL DATA:  Shortness of breath, cough EXAM: CHEST - 2 VIEW COMPARISON:  Prior chest x-ray 09/19/2013 FINDINGS: Enlargement of the cardiopericardial silhouette. Diffuse interstitial prominence bilaterally most suggestive of pulmonary edema. No large effusion or pneumothorax. Overall, limited evaluation secondary to patient body habitus and poor penetration. No acute osseous abnormality. IMPRESSION: 1. Cardiomegaly and diffuse interstitial airspace opacities bilaterally. Findings are favored to reflect pulmonary edema. However, acute viral or atypical respiratory  infection  could appear similar. 2. Very limited chest x-ray due to patient body habitus and limited penetration. Electronically Signed   By: Malachy Moan M.D.   On: 12/14/2021 07:40      LOS: 0 days    Jacquelin Hawking, MD [facility] 12/15/2021, 11:14 AM   If 7PM-7AM, please contact night-coverage [email or web address]

## 2021-12-16 ENCOUNTER — Encounter (HOSPITAL_COMMUNITY): Payer: Self-pay | Admitting: Internal Medicine

## 2021-12-16 DIAGNOSIS — R9431 Abnormal electrocardiogram [ECG] [EKG]: Secondary | ICD-10-CM

## 2021-12-16 DIAGNOSIS — J9601 Acute respiratory failure with hypoxia: Secondary | ICD-10-CM

## 2021-12-16 DIAGNOSIS — I5020 Unspecified systolic (congestive) heart failure: Secondary | ICD-10-CM

## 2021-12-16 DIAGNOSIS — E876 Hypokalemia: Secondary | ICD-10-CM

## 2021-12-16 DIAGNOSIS — I2489 Other forms of acute ischemic heart disease: Secondary | ICD-10-CM

## 2021-12-16 LAB — RAPID URINE DRUG SCREEN, HOSP PERFORMED
Amphetamines: NOT DETECTED
Barbiturates: NOT DETECTED
Benzodiazepines: NOT DETECTED
Cocaine: NOT DETECTED
Opiates: NOT DETECTED
Tetrahydrocannabinol: POSITIVE — AB

## 2021-12-16 LAB — RESPIRATORY PANEL BY PCR

## 2021-12-16 LAB — URINALYSIS, ROUTINE W REFLEX MICROSCOPIC
Bilirubin Urine: NEGATIVE
Glucose, UA: NEGATIVE mg/dL
Ketones, ur: NEGATIVE mg/dL
Leukocytes,Ua: NEGATIVE
Nitrite: NEGATIVE
Protein, ur: NEGATIVE mg/dL
Specific Gravity, Urine: 1.013 (ref 1.005–1.030)
pH: 6 (ref 5.0–8.0)

## 2021-12-16 LAB — CBC
HCT: 42.8 % (ref 36.0–46.0)
Hemoglobin: 13.5 g/dL (ref 12.0–15.0)
MCH: 28.9 pg (ref 26.0–34.0)
MCHC: 31.5 g/dL (ref 30.0–36.0)
MCV: 91.6 fL (ref 80.0–100.0)
Platelets: 304 10*3/uL (ref 150–400)
RBC: 4.67 MIL/uL (ref 3.87–5.11)
RDW: 13.2 % (ref 11.5–15.5)
WBC: 8.3 10*3/uL (ref 4.0–10.5)
nRBC: 0 % (ref 0.0–0.2)

## 2021-12-16 LAB — HEPARIN LEVEL (UNFRACTIONATED): Heparin Unfractionated: 0.4 IU/mL (ref 0.30–0.70)

## 2021-12-16 LAB — HIV ANTIBODY (ROUTINE TESTING W REFLEX): HIV Screen 4th Generation wRfx: NONREACTIVE

## 2021-12-16 MED ORDER — ORAL CARE MOUTH RINSE: 15.0000 mL | OROMUCOSAL | Status: DC | PRN

## 2021-12-16 NOTE — Progress Notes (Signed)
Heart Failure Nurse Navigator Progress Note  PCP: Patient, No Pcp Per PCP-Cardiologist: None Admission Diagnosis: None Admitted from: Home to [facility]  Presentation:   Rebecca Tapia presented with shortness of breath, with non-productive cough, CTA suggested possible PE and concerning for multifocal pneumonia. Patient started on IV heparin and IV antibiotics. Lower extremity duplex was negative for acute DVT. Patient placed on O2 at 2 Liters. BNP 257, BMI 58,   Navigator called and spoke with patient at [facility] [employer], education done over the phone with Heart failure sign and symptoms, daily weights, when to call her doctor or go to the ED, Diet/ fluid restrictions ( patient reported she eats out a lot, drinks lots of water about 1 gallon, drinks ETOH on the weekends and smoke marijuana 3- 4 days per weeks, Taking all medications as prescribed, attending all medical appointments. Patient voiced her understanding of education and is scheduled for a hospital HF TOC appointment on 12/27/2021 @ 10 am.    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  Other Topics Concern   Not on file  Social History Narrative   Not on file   Social Determinants of Health   Financial Resource Strain: High Risk  (12/16/2021)   Overall Financial Resource Strain (CARDIA)    Difficulty of Paying Living Expenses: Hard  Food Insecurity: No Food Insecurity (12/15/2021)   Hunger Vital Sign    Worried About Running Out of Food in the Last Year: Never true    Ran Out of Food in the Last Year: Never true  Transportation Needs: No Transportation Needs (12/16/2021)   PRAPARE - Hydrologist (Medical): No    Lack of Transportation (Non-Medical): No  Physical Activity: Not on file  Stress: Not on file  Social Connections: Not on file    High Risk Criteria for Readmission and/or Poor Patient Outcomes: Heart failure hospital admissions (last 6 months): 0  No Show rate: 25 % Difficult social situation: No Demonstrates medication adherence: yes Primary Language: English Literacy level: Reading, writing, and comprehension  Barriers of Care:   New HF Education Medication / appointment compliance Substance cessation ( ETOH/ THC) Diet/ fluid restrictions  Considerations/Referrals:   Referral made to Heart Failure Pharmacist Stewardship: yes Referral made to Heart Failure CSW/NCM TOC: No Referral made to Heart & Vascular TOC clinic: Yes, 12/27/2021 @ 10 am.   Items for Follow-up on DC/TOC: New HF education Medication/ appointment compliance Substance cessation ( ETOH/ THC/ Smoking) Diet/ fluid restrictions    Earnestine Leys, BSN, Clinical cytogeneticist Only

## 2021-12-16 NOTE — Progress Notes (Signed)
ANTICOAGULATION CONSULT NOTE - Follow Up Consult  Pharmacy Consult for Heparin Indication: suspected pulmonary embolus  Allergies  Allergen Reactions   Bee Venom Anaphylaxis    Patient Measurements: Height: 6' (182.9 cm) Weight: (!) 196 kg (432 lb) IBW/kg (Calculated) : 73.1 Heparin Dosing Weight: 125 kg  Vital Signs: Temp: 98.2 F (36.8 C) (10/30 0444) Temp Source: Oral (10/30 0444) BP: 111/76 (10/30 0444) Pulse Rate: 111 (10/29 2046)  Labs: Recent Labs    12/14/21 2121 12/14/21 2318 12/15/21 0645 12/15/21 1426 12/16/21 0449  HGB 14.0  --  14.6  --  13.5  HCT 42.3  --  44.4  --  42.8  PLT 312  --  320  --  304  HEPARINUNFRC  --   --  0.16* 0.34 0.40  CREATININE 0.99  --  0.82  --   --   TROPONINIHS 33* 29*  --   --   --     Estimated Creatinine Clearance: 200.7 mL/min (by C-G formula based on SCr of 0.82 mg/dL).   Medications:  Infusions:   cefTRIAXone (ROCEPHIN)  IV 2 g (12/15/21 2245)   heparin 2,500 Units/hr (12/16/21 0454)    Assessment: 73 yoF presented to ED on 10/28 and was admitted for SOB x10 days.  CT was inconclusive but suggestive of PE.  Pharmacy is consulted to dose heparin IV.  10/29 LE dopplers: negative for DVT, but exam limited by body habitus.   Today, 12/16/2021: Heparin level 0.4, remains therapeutic  CBC: stable, WNL No bleeding or complications reported   Goal of Therapy:  Heparin level 0.3-0.7 units/ml Monitor platelets by anticoagulation protocol: Yes   Plan:  Continue heparin IV infusion at 2500 units/hr Daily heparin level and CBC F/u VQ Scan F/u long-term anticoagulation plans.   Gretta Arab PharmD, BCPS Clinical Pharmacist [facility] main pharmacy [phone] 12/16/2021 7:23 AM

## 2021-12-16 NOTE — Consult Note (Addendum)
Cardiology Consultation   Patient ID: URIEL SAGER MRN: [id number]; DOB: 09-Feb-1996  Admit date: 12/14/2021 Date of Consult: 12/16/2021  PCP:  Patient, No Pcp Per   [facility] Providers Cardiologist:  New   Patient Profile:   Carrolyn Himelright Packman is a 26 y.o. female with no significant past medical history except morbid obesity with BMI of 58 who is being seen 12/16/2021 for the evaluation of reduced LV function at the request of Dr. Lonny Prude.  History of Present Illness:   Ms. Puente was in usual state of health up until 10 days ago when started to having shortness of breath and nonproductive cough.  Later had yellow sputum.  Denies chest pain, palpitation, dizziness, orthopnea, PND, syncope, lower extremity edema or melena.  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  No sick contact.  No fever or chills.  Due to worsening breathing, she came to ER for further evaluation.  Work-up revealed multifocal pneumonia and possible pulmonary embolism on CT angio of chest.  She was started on broad-spectrum antibiotic and IV heparin.  Pending VQ scan.  Echocardiogram showed reduced LV function at 35 to 40%, global hypokinesis,ndeterminate diastolic filling due to E-A fusion.  Normal RV function.  Cardiology asked for further evaluation.  Hs-troponin 33>>29 Renal function and electrolytes are stable  Patient denies any cardiac history growing up.  Father died of congestive heart failure at age 74 but he was not taking his medication.  No sudden family cardiac death.  Drinks over weekend.  Smokes bomb inhalation and marijuana.  Denies cocaine or heroin use.  She has two-story house and goes up and down of stairs without any problem.  No regular exercise.  Blood pressure intermittently elevated and felt anxious or stressed out.  Echo 12/15/21  1. Left ventricular ejection fraction, by estimation, is 35 to 40%. The  left ventricle has moderately decreased function. The  left ventricle  demonstrates global hypokinesis. The left ventricular internal cavity size  was moderately dilated. Indeterminate   diastolic filling due to E-A fusion.   2. Right ventricular systolic function is normal. The right ventricular  size is normal.   3. Left atrial size was moderately dilated.   4. The mitral valve is normal in structure. Trivial mitral valve  regurgitation. No evidence of mitral stenosis.   5. The aortic valve is tricuspid. Aortic valve regurgitation is not  visualized. No aortic stenosis is present.   6. The inferior vena cava is normal in size with greater than 50%  respiratory variability, suggesting right atrial pressure of 3 mmHg.    Past Medical History:  Diagnosis Date   Morbid obesity ([place])     Past Surgical History:  Procedure Laterality Date   WISDOM TOOTH EXTRACTION      Inpatient Medications: Scheduled Meds:  azithromycin  500 mg Oral QHS   carvedilol  12.5 mg Oral BID WC   ipratropium-albuterol  3 mL Nebulization Q6H   methylPREDNISolone (SOLU-MEDROL) injection  40 mg Intravenous Q24H   Continuous Infusions:  cefTRIAXone (ROCEPHIN)  IV 2 g (12/15/21 2245)   heparin 2,500 Units/hr (12/16/21 0454)   PRN Meds: acetaminophen, hydrALAZINE **OR** hydrALAZINE, melatonin, mouth rinse, polyethylene glycol, prochlorperazine  Allergies:    Allergies  Allergen Reactions   Bee Venom Anaphylaxis    Social History:   Social History   Socioeconomic History   Marital status: Single    Spouse name: Not on file   Number of children:  Not on file   Years of education: Not on file   Highest education level: Not on file  Occupational History   Not on file  Tobacco Use   Smoking status: Never   Smokeless tobacco: Never  Substance and Sexual Activity   Alcohol use: No   Drug use: No   Sexual activity: Yes    Birth control/protection: I.U.D.  Other Topics Concern   Not on file  Social History Narrative   Not on file   Social  Determinants of Health   Financial Resource Strain: Not on file  Food Insecurity: No Food Insecurity (12/15/2021)   Hunger Vital Sign    Worried About Running Out of Food in the Last Year: Never true    Ran Out of Food in the Last Year: Never true  Transportation Needs: No Transportation Needs (12/15/2021)   PRAPARE - Hydrologist (Medical): No    Lack of Transportation (Non-Medical): No  Physical Activity: Not on file  Stress: Not on file  Social Connections: Not on file  Intimate Partner Violence: Unknown (12/15/2021)   Humiliation, Afraid, Rape, and Kick questionnaire    Fear of Current or Ex-Partner: No    Emotionally Abused: No    Physically Abused: No    Sexually Abused: Not on file    Family History:   Family History  Problem Relation Age of Onset   Hypertension Father    Heart disease Father      ROS:  Please see the history of present illness.  All other ROS reviewed and negative.     Physical Exam/Data:   Vitals:   12/16/21 0216 12/16/21 0222 12/16/21 0444 12/16/21 0730  BP: 120/86  111/76   Pulse:    (!) 107  Resp: (!) 27  (!) 25 20  Temp: 98 F (36.7 C)  98.2 F (36.8 C)   TempSrc: Oral  Oral   SpO2: 97% 100% 98% 98%  Weight:      Height:        Intake/Output Summary (Last 24 hours) at 12/16/2021 0837 Last data filed at 12/16/2021 0100 Gross per 24 hour  Intake 729.59 ml  Output 150 ml  Net 579.59 ml      12/14/2021    8:56 PM 09/19/2013    1:22 PM 09/12/2012   10:01 AM  Last 3 Weights  Weight (lbs) 432 lb 365 lb 330 lb  Weight (kg) 195.954 kg 165.563 kg 149.687 kg     Body mass index is 58.59 kg/m.  General:  Well nourished, well developed, in no acute distress HEENT: normal Neck: no JVD Vascular: No carotid bruits; Distal pulses 2+ bilaterally Cardiac:  normal S1, S2; regular tachycardic; no murmur  Lungs:  clear to auscultation bilaterally, no wheezing, rhonchi or rales  Abd: soft, nontender, no  hepatomegaly  Ext: no edema Musculoskeletal:  No deformities, BUE and BLE strength normal and equal Skin: warm and dry  Neuro:  CNs 2-12 intact, no focal abnormalities noted Psych:  Normal affect   EKG:  The EKG was personally reviewed and demonstrates: Sinus tachycardia Telemetry:  Telemetry was personally reviewed and demonstrates: Sinus tachycardia  Relevant CV Studies: As summarized above  Laboratory Data:  High Sensitivity Troponin:   Recent Labs  Lab 12/14/21 2121 12/14/21 2318  TROPONINIHS 33* 29*     Chemistry Recent Labs  Lab 12/14/21 2121 12/15/21 0645  NA 138 135  K 3.4* 3.8  CL 108 106  CO2 21*  21*  GLUCOSE 109* 122*  BUN 11 11  CREATININE 0.99 0.82  CALCIUM 8.6* 8.9  MG  --  2.2  GFRNONAA >60 >60  ANIONGAP 9 8    Recent Labs  Lab 12/14/21 2121 12/15/21 0645  PROT 7.3 8.2*  ALBUMIN 3.7 3.8  AST 31 41  ALT 37 52*  ALKPHOS 61 66  BILITOT 1.3* 1.2   Lipids No results for input(s): "CHOL", "TRIG", "HDL", "LABVLDL", "LDLCALC", "CHOLHDL" in the last 168 hours.  Hematology Recent Labs  Lab 12/14/21 2121 12/15/21 0645 12/16/21 0449  WBC 10.0 7.8 8.3  RBC 4.80 4.98 4.67  HGB 14.0 14.6 13.5  HCT 42.3 44.4 42.8  MCV 88.1 89.2 91.6  MCH 29.2 29.3 28.9  MCHC 33.1 32.9 31.5  RDW 12.8 12.9 13.2  PLT 312 320 304   Thyroid No results for input(s): "TSH", "FREET4" in the last 168 hours.  BNP Recent Labs  Lab 12/14/21 2121  BNP 257.0*    DDimer  Recent Labs  Lab 12/15/21 0002  DDIMER 1.63*     Radiology/Studies:  ECHOCARDIOGRAM COMPLETE  Result Date: 12/15/2021    ECHOCARDIOGRAM REPORT   Patient Name:   TORRIE ULCH Date of Exam: 12/15/2021 Medical Rec #:  [id number]        Height:       72.0 in Accession #:    [id number]       Weight:       432.0 lb Date of Birth:  [birth year]         BSA:          2.956 m Patient Age:    26 years         BP:           132/72 mmHg Patient Gender: F                HR:           115 bpm. Exam Location:   Inpatient Procedure: 2D Echo, Cardiac Doppler, Color Doppler and Intracardiac            Opacification Agent Indications:    Pulmonary Embolus I26.09  History:        Patient has no prior history of Echocardiogram examinations.  Sonographer:    Bernadene Person RDCS Referring Phys: [id number] Kayleen Memos  Sonographer Comments: Patient is obese. IMPRESSIONS  1. Left ventricular ejection fraction, by estimation, is 35 to 40%. The left ventricle has moderately decreased function. The left ventricle demonstrates global hypokinesis. The left ventricular internal cavity size was moderately dilated. Indeterminate  diastolic filling due to E-A fusion.  2. Right ventricular systolic function is normal. The right ventricular size is normal.  3. Left atrial size was moderately dilated.  4. The mitral valve is normal in structure. Trivial mitral valve regurgitation. No evidence of mitral stenosis.  5. The aortic valve is tricuspid. Aortic valve regurgitation is not visualized. No aortic stenosis is present.  6. The inferior vena cava is normal in size with greater than 50% respiratory variability, suggesting right atrial pressure of 3 mmHg. FINDINGS  Left Ventricle: Left ventricular ejection fraction, by estimation, is 35 to 40%. The left ventricle has moderately decreased function. The left ventricle demonstrates global hypokinesis. Definity contrast agent was given IV to delineate the left ventricular endocardial borders. The left ventricular internal cavity size was moderately dilated. There is no left ventricular hypertrophy. Indeterminate diastolic filling due to E-A fusion. Right Ventricle: The right ventricular size is  normal. No increase in right ventricular wall thickness. Right ventricular systolic function is normal. Left Atrium: Left atrial size was moderately dilated. Right Atrium: Right atrial size was normal in size. Pericardium: There is no evidence of pericardial effusion. Mitral Valve: The mitral valve is normal  in structure. Trivial mitral valve regurgitation. No evidence of mitral valve stenosis. Tricuspid Valve: The tricuspid valve is normal in structure. Tricuspid valve regurgitation is trivial. No evidence of tricuspid stenosis. Aortic Valve: The aortic valve is tricuspid. Aortic valve regurgitation is not visualized. No aortic stenosis is present. Pulmonic Valve: The pulmonic valve was normal in structure. Pulmonic valve regurgitation is mild. No evidence of pulmonic stenosis. Aorta: The aortic root is normal in size and structure. Venous: The inferior vena cava is normal in size with greater than 50% respiratory variability, suggesting right atrial pressure of 3 mmHg. IAS/Shunts: The interatrial septum was not well visualized.  LEFT VENTRICLE PLAX 2D LVIDd:         7.20 cm      Diastology LVIDs:         6.00 cm      LV e' medial:  5.03 cm/s LV PW:         1.20 cm      LV e' lateral: 6.65 cm/s LV IVS:        1.10 cm LVOT diam:     2.40 cm LV SV:         49 LV SV Index:   17 LVOT Area:     4.52 cm  LV Volumes (MOD) LV vol d, MOD A2C: 233.0 ml LV vol d, MOD A4C: 265.0 ml LV vol s, MOD A2C: 160.0 ml LV vol s, MOD A4C: 162.0 ml LV SV MOD A2C:     73.0 ml LV SV MOD A4C:     265.0 ml LV SV MOD BP:      89.5 ml RIGHT VENTRICLE RV S prime:     16.10 cm/s TAPSE (M-mode): 2.2 cm LEFT ATRIUM              Index        RIGHT ATRIUM           Index LA diam:        5.60 cm  1.89 cm/m   RA Area:     18.10 cm LA Vol (A2C):   153.0 ml 51.77 ml/m  RA Volume:   51.10 ml  17.29 ml/m LA Vol (A4C):   140.0 ml 47.37 ml/m LA Biplane Vol: 152.0 ml 51.43 ml/m  AORTIC VALVE LVOT Vmax:   83.60 cm/s LVOT Vmean:  53.300 cm/s LVOT VTI:    0.108 m  AORTA Ao Root diam: 3.50 cm Ao Asc diam:  3.70 cm MR Peak grad: 90.6 mmHg MR Mean grad: 61.0 mmHg   SHUNTS MR Vmax:      476.00 cm/s Systemic VTI:  0.11 m MR Vmean:     369.0 cm/s  Systemic Diam: 2.40 cm Jenkins Rouge MD Electronically signed by Jenkins Rouge MD Signature Date/Time: 12/15/2021/5:24:18  PM    Final    VAS [place] LOWER EXTREMITY VENOUS (DVT)  Result Date: 12/15/2021  Lower Venous DVT Study Patient Name:  TRAEH MONDS  Date of Exam:   12/15/2021 Medical Rec #: [id number]         Accession #:    [id number] Date of Birth: 12-01-1995          Patient Gender: F Patient Age:   50  years Exam Location:  [facility] Procedure:      VAS [place] LOWER EXTREMITY VENOUS (DVT) Referring Phys: CAROLE HALL --------------------------------------------------------------------------------  Indications: Swelling.  Risk Factors: None identified. Limitations: Body habitus and poor ultrasound/tissue interface. Comparison Study: No prior studies. Performing Technologist: Oliver Hum RVT  Examination Guidelines: A complete evaluation includes B-mode imaging, spectral Doppler, color Doppler, and power Doppler as needed of all accessible portions of each vessel. Bilateral testing is considered an integral part of a complete examination. Limited examinations for reoccurring indications may be performed as noted. The reflux portion of the exam is performed with the patient in reverse Trendelenburg.  +---------+---------------+---------+-----------+----------+--------------+ RIGHT    CompressibilityPhasicitySpontaneityPropertiesThrombus Aging +---------+---------------+---------+-----------+----------+--------------+ CFV      Full           Yes      Yes                                 +---------+---------------+---------+-----------+----------+--------------+ SFJ      Full                                                        +---------+---------------+---------+-----------+----------+--------------+ FV Prox  Full                                                        +---------+---------------+---------+-----------+----------+--------------+ FV Mid   Full                                                         +---------+---------------+---------+-----------+----------+--------------+ FV Distal               Yes      Yes                                 +---------+---------------+---------+-----------+----------+--------------+ PFV      Full                                                        +---------+---------------+---------+-----------+----------+--------------+ POP      Full           Yes      Yes                                 +---------+---------------+---------+-----------+----------+--------------+ PTV      Full                                                        +---------+---------------+---------+-----------+----------+--------------+ PERO  Full                                                        +---------+---------------+---------+-----------+----------+--------------+   +---------+---------------+---------+-----------+----------+--------------+ LEFT     CompressibilityPhasicitySpontaneityPropertiesThrombus Aging +---------+---------------+---------+-----------+----------+--------------+ CFV      Full           Yes      Yes                                 +---------+---------------+---------+-----------+----------+--------------+ SFJ      Full                                                        +---------+---------------+---------+-----------+----------+--------------+ FV Prox  Full                                                        +---------+---------------+---------+-----------+----------+--------------+ FV Mid   Full                                                        +---------+---------------+---------+-----------+----------+--------------+ FV Distal               Yes      Yes                                 +---------+---------------+---------+-----------+----------+--------------+ PFV      Full                                                         +---------+---------------+---------+-----------+----------+--------------+ POP      Full           Yes      Yes                                 +---------+---------------+---------+-----------+----------+--------------+ PTV      Full                                                        +---------+---------------+---------+-----------+----------+--------------+ PERO     Full                                                        +---------+---------------+---------+-----------+----------+--------------+  Summary: RIGHT: - There is no evidence of deep vein thrombosis in the lower extremity. However, portions of this examination were limited- see technologist comments above.  - No cystic structure found in the popliteal fossa.  LEFT: - There is no evidence of deep vein thrombosis in the lower extremity. However, portions of this examination were limited- see technologist comments above.  - No cystic structure found in the popliteal fossa.  *See table(s) above for measurements and observations. Electronically signed by Jamelle Haring on 12/15/2021 at 2:47:03 PM.    Final    CT Angio Chest PE W and/or Wo Contrast  Result Date: 12/14/2021 CLINICAL DATA:  Pulmonary embolism suspected, high probability. Shortness of breath for 1 week. EXAM: CT ANGIOGRAPHY CHEST WITH CONTRAST TECHNIQUE: Multidetector CT imaging of the chest was performed using the standard protocol during bolus administration of intravenous contrast. Multiplanar CT image reconstructions and MIPs were obtained to evaluate the vascular anatomy. RADIATION DOSE REDUCTION: This exam was performed according to the departmental dose-optimization program which includes automated exposure control, adjustment of the mA and/or kV according to patient size and/or use of iterative reconstruction technique. CONTRAST:  39mL OMNIPAQUE IOHEXOL 350 MG/ML SOLN COMPARISON:  None Available. FINDINGS: Cardiovascular: The heart is enlarged and there  is no pericardial effusion. The aorta is normal in caliber. The pulmonary trunk is distended which may be associated with underlying pulmonary artery hypertension. Examination of the pulmonary arteries is extremely limited due to patient's body habitus and respiratory motion artifact. There is a possible pulmonary embolus in the left lower lobe lobar, segmental and subsegmental arteries versus artifact. Evaluation of the main right pulmonary artery is limited due to artifact. Mediastinum/Nodes: There suspected prominent lymph nodes in the mediastinum measuring up to 1 cm. Prominent lymph nodes are noted at the right hilum. No axillary lymphadenopathy. The thyroid gland, trachea, and esophagus are within normal limits. Lungs/Pleura: Multifocal airspace opacities are noted in the lungs bilaterally no effusion or pneumothorax. Upper Abdomen: No acute abnormality. Musculoskeletal: No acute osseous abnormality. Review of the MIP images confirms the above findings. IMPRESSION: 1. Examination of the pulmonary arteries is significantly limited due to respiratory motion artifact and patient's body habitus. There is a possible left lower lobe lobar, and segmental pulmonary artery filling defect versus artifact. Significant artifact is also noted in the region of the right main pulmonary artery in the possibility of underlying embolus can not be excluded. Consider short-term repeat follow-up exam or V/Q scan. 2. Scattered airspace opacities in the lungs bilaterally, suspicious for multifocal pneumonia. 3. Distended pulmonary trunk suggesting underlying pulmonary artery hypertension. 4. Cardiomegaly. Electronically Signed   By: Brett Fairy M.D.   On: 12/14/2021 22:46   DG Chest 2 View  Result Date: 12/14/2021 CLINICAL DATA:  Shortness of breath, cough EXAM: CHEST - 2 VIEW COMPARISON:  Prior chest x-ray 09/19/2013 FINDINGS: Enlargement of the cardiopericardial silhouette. Diffuse interstitial prominence bilaterally most  suggestive of pulmonary edema. No large effusion or pneumothorax. Overall, limited evaluation secondary to patient body habitus and poor penetration. No acute osseous abnormality. IMPRESSION: 1. Cardiomegaly and diffuse interstitial airspace opacities bilaterally. Findings are favored to reflect pulmonary edema. However, acute viral or atypical respiratory infection could appear similar. 2. Very limited chest x-ray due to patient body habitus and limited penetration. Electronically Signed   By: Jacqulynn Cadet M.D.   On: 12/14/2021 07:40     Assessment and Plan:   HFrEF - Echoardiogram showed reduced LV function at 35 to 40%, global hypokinesis, indeterminate  diastolic filling due to E-A fusion.  Normal RV function. -Appears euvolemic on exam -Likely nonischemic cardiomyopathy from possible viral illness -On carvedilol 12.5 mg twice daily. -We will gradually add guideline directed medical therapy. -Given sinus tachycardia (due to deconditioning/morbid obesity with possible PE) will consider increasing carvedilol or change to Toprol-XL -Could consider cardiac MRI versus repeat echocardiogram in few months/week  2.  Acute hypoxic respiratory failure secondary to multifocal pneumonia and possible pulmonary embolism -Undergoing treatment and evaluation by primary team -  Normal RV function by echo  For questions or updates, please contact [place] Please consult [email or web address] for contact info under  Jarrett Soho, PA  12/16/2021 8:37 AM   Patient seen and examined, note reviewed with the signed Advanced Practice Provider. I personally reviewed laboratory data, imaging studies and relevant notes. I independently examined the patient and formulated the important aspects of the plan. I have personally discussed the plan with the patient and/or family. Comments or changes to the note/plan are indicated below.  Patient seen and examined at her bedside. Newly diagnosed  HFref likely nonischemic cardiomyopathy For now it would be best to start medical management in this patient started on carvedilol 12.5 mg twice daily her blood pressure is on the lower side as well so will not be able to accommodate for other, directed medical therapy.  If her blood pressure remains low I would like to transition the patient to Toprol-XL for hopefully giving [place] room to add these other medications.  Once we are able to optimize her medication therapy which will likely be in the outpatient setting.  She will benefit from a cardiac MRI at that time.  In terms of hypoxic respiratory failure this is being managed by the primary team secondary suspecting multifocal pneumonia and likely pulmonary embolism.  No RV failure.   Berniece Salines DO, MS [facility] Attending Cardiologist [facility]  [address] [place], [place] [address] [phone] Website: [email or web address]

## 2021-12-16 NOTE — Progress Notes (Signed)
PROGRESS NOTE    Gael Bevels Rubendall  [id number] DOB: 18-Apr-1995 DOA: 12/14/2021 PCP: Patient, No Pcp Per   Brief Narrative: Lesleigh Prestigiacomo Vonbargen is a 26 y.o. female with a history of morbid obesity and marijuana use. Patient is on birth control with Mirena IUD in place. Patient presented secondary to progressive shortness of breath with associated non-productive cough. CTA imaging suggested possible PE and was concerning for multifocal pneumonia. Patient started on empiric antibiotics and Heparin IV in addition to supplemental oxygen for associated hypoxia and Solu-medrol/Duoneb for some wheezing. Lower extremity venous duplex was negative for acute DVT. Transthoracic Echocardiogram obtained and did not show evidence of RV strain, but was significant for newly diagnosed dilated cardiomyopathy. Cardiology consulted.   Assessment and Plan:  Acute respiratory failure with hypoxia Secondary to pneumonia in addition to possible PE. Patient requiring 2 L/min of supplemental oxygen. Patient also started on Solu-medrol and Duoneb nebulizer treatments. -Continue Solu-medrol/Duoneb  Multifocal pneumonia Noted on CTA chest. Recently negative COVID-19 and influenza testing. No URI symptoms. Associated hypoxia and dyspnea on exertion. Patient started empirically on Ceftriaxone and azithromycin. No procalcitonin available. No leukocytosis. RVP negative. Procalcitonin negative. -Continue Ceftriaxone and azithromycin  Heart failure with reduced EF Newly diagnosed without clinical evidence of acute failure. LVEF of 35-40% with associated global hypokinesis. Started on Coreg. Cardiology consulted and plan for goal directed medical therapy and likely outpatient cardiac MRI. -Continue Coreg -Cardiology recommendations: possible transition to Toprol XL  Demand ischemia Mildly elevated. Likely secondary to demand ischemia. No chest pain. EKG non-ischemic. Transthoracic Echocardiogram negative for right heart  strain.  Possible pulmonary embolism Possible filling defect vs artifact on CTA chest. No chest pain. Tachycardia. No dyspnea at rest but present with exertion. Patient is on progestin only IUD. Patient started empirically on Heparin IV. LE venous duplex negative for VTE. Unable to obtain V/Q scan secondary to weight limit. Transthoracic Echocardiogram without RV failure. -Continue Heparin IV for now -Repeat CTA chest on 10/31   Prolonged QTc Still prolonged on repeat EKG. -Avoid QT prolonging medication as able -Continue telemetry  Hypokalemia Mild with potassium of 3.4. Resolved.  Non-anion gap metabolic acidosis Mild  Hyperbilirubinemia Minimally elevated. Trended down. Resolved.  Morbid obesity Estimated body mass index is 58.59 kg/m as calculated from the following:   Height as of this encounter: 6' (1.829 m).   Weight as of this encounter: 196 kg.   DVT prophylaxis: Heparin IV Code Status:   Code Status: Full Code Family Communication: Mother at bedside Disposition Plan: Discharge home likely in 1 day pending repeat CTA chest and cardiology recommendations/management in addition to hopeful ability to wean oxygen to room air   Consultants:  Cardiology  Procedures:  10/29: LE venous duplex 10/29: Transthoracic Echocardiogram  Antimicrobials: Ceftriaxone Azithromycin    Subjective: Patient reports improvement in dyspnea. No chest pain. Cough present. Some discomfort with deep breaths which has improved.  Objective: BP 111/76 (BP Location: Right Arm)   Pulse (!) 107   Temp 98.2 F (36.8 C) (Oral)   Resp 20   Ht 6' (1.829 m)   Wt (!) 196 kg   SpO2 98%   BMI 58.59 kg/m   Examination:  General exam: Appears calm and comfortable Respiratory system: Clear to auscultation. Respiratory effort normal. Cardiovascular system: S1 & S2 heard. Tachycardia with normal rhythm. No murmurs, rubs, gallops or clicks. Gastrointestinal system: Abdomen is nondistended,  soft and nontender. No organomegaly or masses felt. Normal bowel sounds heard. Central nervous system: Alert  and oriented. No focal neurological deficits. Musculoskeletal: No edema. No calf tenderness Skin: No cyanosis. No rashes Psychiatry: Judgement and insight appear normal. Mood & affect appropriate.    Data Reviewed: I have personally reviewed following labs and imaging studies  CBC Lab Results  Component Value Date   WBC 8.3 12/16/2021   RBC 4.67 12/16/2021   HGB 13.5 12/16/2021   HCT 42.8 12/16/2021   MCV 91.6 12/16/2021   MCH 28.9 12/16/2021   PLT 304 12/16/2021   MCHC 31.5 12/16/2021   RDW 13.2 12/16/2021   LYMPHSABS 0.5 (L) 12/15/2021   MONOABS 0.4 12/15/2021   EOSABS 0.0 12/15/2021   BASOSABS 0.0 [id number]     Last metabolic panel Lab Results  Component Value Date   NA 135 12/15/2021   K 3.8 12/15/2021   CL 106 12/15/2021   CO2 21 (L) 12/15/2021   BUN 11 12/15/2021   CREATININE 0.82 12/15/2021   GLUCOSE 122 (H) 12/15/2021   GFRNONAA >60 12/15/2021   CALCIUM 8.9 12/15/2021   PHOS 4.2 12/15/2021   PROT 8.2 (H) 12/15/2021   ALBUMIN 3.8 12/15/2021   BILITOT 1.2 12/15/2021   ALKPHOS 66 12/15/2021   AST 41 12/15/2021   ALT 52 (H) 12/15/2021   ANIONGAP 8 12/15/2021    GFR: Estimated Creatinine Clearance: 200.7 mL/min (by C-G formula based on SCr of 0.82 mg/dL).  Recent Results (from the past 240 hour(s))  Group A Strep by PCR     Status: None   Collection Time: 12/14/21 12:48 AM   Specimen: Anterior Nasal Swab; Sterile Swab  Result Value Ref Range Status   Group A Strep by PCR NOT DETECTED NOT DETECTED Final    Comment: Performed at [facility], [address] [place], [place] [address]  Resp Panel by RT-PCR (Flu A&B, Covid) Anterior Nasal Swab     Status: None   Collection Time: 12/14/21 12:49 AM   Specimen: Anterior Nasal Swab  Result Value Ref Range Status   SARS Coronavirus 2 by RT PCR NEGATIVE NEGATIVE Final    Comment:  (NOTE) SARS-CoV-2 target nucleic acids are NOT DETECTED.  The SARS-CoV-2 RNA is generally detectable in upper respiratory specimens during the acute phase of infection. The lowest concentration of SARS-CoV-2 viral copies this assay can detect is 138 copies/mL. A negative result does not preclude SARS-Cov-2 infection and should not be used as the sole basis for treatment or other patient management decisions. A negative result may occur with  improper specimen collection/handling, submission of specimen other than nasopharyngeal swab, presence of viral mutation(s) within the areas targeted by this assay, and inadequate number of viral copies(<138 copies/mL). A negative result must be combined with clinical observations, patient history, and epidemiological information. The expected result is Negative.  Fact Sheet for Patients:  EntrepreneurPulse.com.au  Fact Sheet for Healthcare Providers:  IncredibleEmployment.be  This test is no t yet approved or cleared by the [place] FDA and  has been authorized for detection and/or diagnosis of SARS-CoV-2 by FDA under an Emergency Use Authorization (EUA). This EUA will remain  in effect (meaning this test can be used) for the duration of the COVID-19 declaration under Section 564(b)(1) of the Act, 21 U.S.C.section 360bbb-3(b)(1), unless the authorization is terminated  or revoked sooner.       Influenza A by PCR NEGATIVE NEGATIVE Final   Influenza B by PCR NEGATIVE NEGATIVE Final    Comment: (NOTE) The Xpert Xpress SARS-CoV-2/FLU/RSV plus assay is intended as an aid in  the diagnosis of influenza from Nasopharyngeal swab specimens and should not be used as a sole basis for treatment. Nasal washings and aspirates are unacceptable for Xpert Xpress SARS-CoV-2/FLU/RSV testing.  Fact Sheet for Patients: EntrepreneurPulse.com.au  Fact Sheet for Healthcare  Providers: IncredibleEmployment.be  This test is not yet approved or cleared by the [place] FDA and has been authorized for detection and/or diagnosis of SARS-CoV-2 by FDA under an Emergency Use Authorization (EUA). This EUA will remain in effect (meaning this test can be used) for the duration of the COVID-19 declaration under Section 564(b)(1) of the Act, 21 U.S.C. section 360bbb-3(b)(1), unless the authorization is terminated or revoked.  Performed at [facility], [place] [address] [place], [place] [address]   Respiratory (~20 pathogens) panel by PCR     Status: None   Collection Time: 12/15/21  7:55 PM   Specimen: Nasopharyngeal Swab; Respiratory  Result Value Ref Range Status   Adenovirus NOT DETECTED NOT DETECTED Final   Coronavirus 229E NOT DETECTED NOT DETECTED Final    Comment: (NOTE) The Coronavirus on the Respiratory Panel, DOES NOT test for the novel  Coronavirus (2019 nCoV)    Coronavirus HKU1 NOT DETECTED NOT DETECTED Final   Coronavirus NL63 NOT DETECTED NOT DETECTED Final   Coronavirus OC43 NOT DETECTED NOT DETECTED Final   Metapneumovirus NOT DETECTED NOT DETECTED Final   Rhinovirus / Enterovirus NOT DETECTED NOT DETECTED Final   Influenza A NOT DETECTED NOT DETECTED Final   Influenza B NOT DETECTED NOT DETECTED Final   Parainfluenza Virus 1 NOT DETECTED NOT DETECTED Final   Parainfluenza Virus 2 NOT DETECTED NOT DETECTED Final   Parainfluenza Virus 3 NOT DETECTED NOT DETECTED Final   Parainfluenza Virus 4 NOT DETECTED NOT DETECTED Final   Respiratory Syncytial Virus NOT DETECTED NOT DETECTED Final   Bordetella pertussis NOT DETECTED NOT DETECTED Final   Bordetella Parapertussis NOT DETECTED NOT DETECTED Final   Chlamydophila pneumoniae NOT DETECTED NOT DETECTED Final   Mycoplasma pneumoniae NOT DETECTED NOT DETECTED Final    Comment: Performed at [facility], [place]. [address] [place], [place] [address]      Radiology  Studies: ECHOCARDIOGRAM COMPLETE  Result Date: 12/15/2021    ECHOCARDIOGRAM REPORT   Patient Name:   VALENE LOUBIER Date of Exam: 12/15/2021 Medical Rec #:  [id number]        Height:       72.0 in Accession #:    [id number]       Weight:       432.0 lb Date of Birth:  08-Mar-1995         BSA:          2.956 m Patient Age:    26 years         BP:           132/72 mmHg Patient Gender: F                HR:           115 bpm. Exam Location:  Inpatient Procedure: 2D Echo, Cardiac Doppler, Color Doppler and Intracardiac            Opacification Agent Indications:    Pulmonary Embolus I26.09  History:        Patient has no prior history of Echocardiogram examinations.  Sonographer:    Bernadene Person RDCS Referring Phys: [id number] Kayleen Memos  Sonographer Comments: Patient is obese. IMPRESSIONS  1. Left ventricular ejection fraction,  by estimation, is 35 to 40%. The left ventricle has moderately decreased function. The left ventricle demonstrates global hypokinesis. The left ventricular internal cavity size was moderately dilated. Indeterminate  diastolic filling due to E-A fusion.  2. Right ventricular systolic function is normal. The right ventricular size is normal.  3. Left atrial size was moderately dilated.  4. The mitral valve is normal in structure. Trivial mitral valve regurgitation. No evidence of mitral stenosis.  5. The aortic valve is tricuspid. Aortic valve regurgitation is not visualized. No aortic stenosis is present.  6. The inferior vena cava is normal in size with greater than 50% respiratory variability, suggesting right atrial pressure of 3 mmHg. FINDINGS  Left Ventricle: Left ventricular ejection fraction, by estimation, is 35 to 40%. The left ventricle has moderately decreased function. The left ventricle demonstrates global hypokinesis. Definity contrast agent was given IV to delineate the left ventricular endocardial borders. The left ventricular internal cavity size was moderately dilated. There  is no left ventricular hypertrophy. Indeterminate diastolic filling due to E-A fusion. Right Ventricle: The right ventricular size is normal. No increase in right ventricular wall thickness. Right ventricular systolic function is normal. Left Atrium: Left atrial size was moderately dilated. Right Atrium: Right atrial size was normal in size. Pericardium: There is no evidence of pericardial effusion. Mitral Valve: The mitral valve is normal in structure. Trivial mitral valve regurgitation. No evidence of mitral valve stenosis. Tricuspid Valve: The tricuspid valve is normal in structure. Tricuspid valve regurgitation is trivial. No evidence of tricuspid stenosis. Aortic Valve: The aortic valve is tricuspid. Aortic valve regurgitation is not visualized. No aortic stenosis is present. Pulmonic Valve: The pulmonic valve was normal in structure. Pulmonic valve regurgitation is mild. No evidence of pulmonic stenosis. Aorta: The aortic root is normal in size and structure. Venous: The inferior vena cava is normal in size with greater than 50% respiratory variability, suggesting right atrial pressure of 3 mmHg. IAS/Shunts: The interatrial septum was not well visualized.  LEFT VENTRICLE PLAX 2D LVIDd:         7.20 cm      Diastology LVIDs:         6.00 cm      LV e' medial:  5.03 cm/s LV PW:         1.20 cm      LV e' lateral: 6.65 cm/s LV IVS:        1.10 cm LVOT diam:     2.40 cm LV SV:         49 LV SV Index:   17 LVOT Area:     4.52 cm  LV Volumes (MOD) LV vol d, MOD A2C: 233.0 ml LV vol d, MOD A4C: 265.0 ml LV vol s, MOD A2C: 160.0 ml LV vol s, MOD A4C: 162.0 ml LV SV MOD A2C:     73.0 ml LV SV MOD A4C:     265.0 ml LV SV MOD BP:      89.5 ml RIGHT VENTRICLE RV S prime:     16.10 cm/s TAPSE (M-mode): 2.2 cm LEFT ATRIUM              Index        RIGHT ATRIUM           Index LA diam:        5.60 cm  1.89 cm/m   RA Area:     18.10 cm LA Vol (A2C):   153.0 ml 51.77 ml/m  RA  Volume:   51.10 ml  17.29 ml/m LA Vol (A4C):    140.0 ml 47.37 ml/m LA Biplane Vol: 152.0 ml 51.43 ml/m  AORTIC VALVE LVOT Vmax:   83.60 cm/s LVOT Vmean:  53.300 cm/s LVOT VTI:    0.108 m  AORTA Ao Root diam: 3.50 cm Ao Asc diam:  3.70 cm MR Peak grad: 90.6 mmHg MR Mean grad: 61.0 mmHg   SHUNTS MR Vmax:      476.00 cm/s Systemic VTI:  0.11 m MR Vmean:     369.0 cm/s  Systemic Diam: 2.40 cm Jenkins Rouge MD Electronically signed by Jenkins Rouge MD Signature Date/Time: 12/15/2021/5:24:18 PM    Final    VAS [place] LOWER EXTREMITY VENOUS (DVT)  Result Date: 12/15/2021  Lower Venous DVT Study Patient Name:  SHILOE BLANCETT  Date of Exam:   12/15/2021 Medical Rec #: [id number]         Accession #:    [id number] Date of Birth: 1996/01/08          Patient Gender: F Patient Age:   75 years Exam Location:  [facility] Procedure:      VAS [place] LOWER EXTREMITY VENOUS (DVT) Referring Phys: Archie Patten HALL --------------------------------------------------------------------------------  Indications: Swelling.  Risk Factors: None identified. Limitations: Body habitus and poor ultrasound/tissue interface. Comparison Study: No prior studies. Performing Technologist: Oliver Hum RVT  Examination Guidelines: A complete evaluation includes B-mode imaging, spectral Doppler, color Doppler, and power Doppler as needed of all accessible portions of each vessel. Bilateral testing is considered an integral part of a complete examination. Limited examinations for reoccurring indications may be performed as noted. The reflux portion of the exam is performed with the patient in reverse Trendelenburg.  +---------+---------------+---------+-----------+----------+--------------+ RIGHT    CompressibilityPhasicitySpontaneityPropertiesThrombus Aging +---------+---------------+---------+-----------+----------+--------------+ CFV      Full           Yes      Yes                                 +---------+---------------+---------+-----------+----------+--------------+ SFJ       Full                                                        +---------+---------------+---------+-----------+----------+--------------+ FV Prox  Full                                                        +---------+---------------+---------+-----------+----------+--------------+ FV Mid   Full                                                        +---------+---------------+---------+-----------+----------+--------------+ FV Distal               Yes      Yes                                 +---------+---------------+---------+-----------+----------+--------------+  PFV      Full                                                        +---------+---------------+---------+-----------+----------+--------------+ POP      Full           Yes      Yes                                 +---------+---------------+---------+-----------+----------+--------------+ PTV      Full                                                        +---------+---------------+---------+-----------+----------+--------------+ PERO     Full                                                        +---------+---------------+---------+-----------+----------+--------------+   +---------+---------------+---------+-----------+----------+--------------+ LEFT     CompressibilityPhasicitySpontaneityPropertiesThrombus Aging +---------+---------------+---------+-----------+----------+--------------+ CFV      Full           Yes      Yes                                 +---------+---------------+---------+-----------+----------+--------------+ SFJ      Full                                                        +---------+---------------+---------+-----------+----------+--------------+ FV Prox  Full                                                        +---------+---------------+---------+-----------+----------+--------------+ FV Mid   Full                                                         +---------+---------------+---------+-----------+----------+--------------+ FV Distal               Yes      Yes                                 +---------+---------------+---------+-----------+----------+--------------+ PFV      Full                                                        +---------+---------------+---------+-----------+----------+--------------+  POP      Full           Yes      Yes                                 +---------+---------------+---------+-----------+----------+--------------+ PTV      Full                                                        +---------+---------------+---------+-----------+----------+--------------+ PERO     Full                                                        +---------+---------------+---------+-----------+----------+--------------+     Summary: RIGHT: - There is no evidence of deep vein thrombosis in the lower extremity. However, portions of this examination were limited- see technologist comments above.  - No cystic structure found in the popliteal fossa.  LEFT: - There is no evidence of deep vein thrombosis in the lower extremity. However, portions of this examination were limited- see technologist comments above.  - No cystic structure found in the popliteal fossa.  *See table(s) above for measurements and observations. Electronically signed by Jamelle Haring on 12/15/2021 at 2:47:03 PM.    Final    CT Angio Chest PE W and/or Wo Contrast  Result Date: 12/14/2021 CLINICAL DATA:  Pulmonary embolism suspected, high probability. Shortness of breath for 1 week. EXAM: CT ANGIOGRAPHY CHEST WITH CONTRAST TECHNIQUE: Multidetector CT imaging of the chest was performed using the standard protocol during bolus administration of intravenous contrast. Multiplanar CT image reconstructions and MIPs were obtained to evaluate the vascular anatomy. RADIATION DOSE REDUCTION: This exam was performed according to the  departmental dose-optimization program which includes automated exposure control, adjustment of the mA and/or kV according to patient size and/or use of iterative reconstruction technique. CONTRAST:  31mL OMNIPAQUE IOHEXOL 350 MG/ML SOLN COMPARISON:  None Available. FINDINGS: Cardiovascular: The heart is enlarged and there is no pericardial effusion. The aorta is normal in caliber. The pulmonary trunk is distended which may be associated with underlying pulmonary artery hypertension. Examination of the pulmonary arteries is extremely limited due to patient's body habitus and respiratory motion artifact. There is a possible pulmonary embolus in the left lower lobe lobar, segmental and subsegmental arteries versus artifact. Evaluation of the main right pulmonary artery is limited due to artifact. Mediastinum/Nodes: There suspected prominent lymph nodes in the mediastinum measuring up to 1 cm. Prominent lymph nodes are noted at the right hilum. No axillary lymphadenopathy. The thyroid gland, trachea, and esophagus are within normal limits. Lungs/Pleura: Multifocal airspace opacities are noted in the lungs bilaterally no effusion or pneumothorax. Upper Abdomen: No acute abnormality. Musculoskeletal: No acute osseous abnormality. Review of the MIP images confirms the above findings. IMPRESSION: 1. Examination of the pulmonary arteries is significantly limited due to respiratory motion artifact and patient's body habitus. There is a possible left lower lobe lobar, and segmental pulmonary artery filling defect versus artifact. Significant artifact is also noted in the region of the right main pulmonary artery in the possibility of underlying embolus can not be  excluded. Consider short-term repeat follow-up exam or V/Q scan. 2. Scattered airspace opacities in the lungs bilaterally, suspicious for multifocal pneumonia. 3. Distended pulmonary trunk suggesting underlying pulmonary artery hypertension. 4. Cardiomegaly.  Electronically Signed   By: Brett Fairy M.D.   On: 12/14/2021 22:46      LOS: 1 day    Cordelia Poche, MD [facility] 12/16/2021, 9:57 AM   If 7PM-7AM, please contact night-coverage [email or web address]

## 2021-12-17 ENCOUNTER — Inpatient Hospital Stay (HOSPITAL_COMMUNITY): Payer: Medicaid Other

## 2021-12-17 ENCOUNTER — Other Ambulatory Visit (HOSPITAL_COMMUNITY): Payer: Self-pay

## 2021-12-17 LAB — EXPECTORATED SPUTUM ASSESSMENT W GRAM STAIN, RFLX TO RESP C

## 2021-12-17 LAB — COMPREHENSIVE METABOLIC PANEL
ALT: 55 U/L — ABNORMAL HIGH (ref 0–44)
AST: 27 U/L (ref 15–41)
Albumin: 3.6 g/dL (ref 3.5–5.0)
Alkaline Phosphatase: 52 U/L (ref 38–126)
Anion gap: 7 (ref 5–15)
BUN: 19 mg/dL (ref 6–20)
CO2: 26 mmol/L (ref 22–32)
Calcium: 9 mg/dL (ref 8.9–10.3)
Chloride: 104 mmol/L (ref 98–111)
Creatinine, Ser: 1.08 mg/dL — ABNORMAL HIGH (ref 0.44–1.00)
GFR, Estimated: >60 mL/min (ref >60)
Glucose, Bld: 97 mg/dL (ref 70–99)
Potassium: 4 mmol/L (ref 3.5–5.1)
Sodium: 137 mmol/L (ref 135–145)
Total Bilirubin: 0.5 mg/dL (ref 0.3–1.2)
Total Protein: 7.3 g/dL (ref 6.5–8.1)

## 2021-12-17 LAB — CBC
HCT: 42.7 % (ref 36.0–46.0)
Hemoglobin: 13.4 g/dL (ref 12.0–15.0)
MCH: 29.1 pg (ref 26.0–34.0)
MCHC: 31.4 g/dL (ref 30.0–36.0)
MCV: 92.8 fL (ref 80.0–100.0)
Platelets: 298 10*3/uL (ref 150–400)
RBC: 4.6 MIL/uL (ref 3.87–5.11)
RDW: 13.2 % (ref 11.5–15.5)
WBC: 10.7 10*3/uL — ABNORMAL HIGH (ref 4.0–10.5)
nRBC: 0 % (ref 0.0–0.2)

## 2021-12-17 LAB — HEPARIN LEVEL (UNFRACTIONATED): Heparin Unfractionated: 0.43 IU/mL (ref 0.30–0.70)

## 2021-12-17 MED ORDER — ALBUTEROL SULFATE (2.5 MG/3ML) 0.083% IN NEBU: 2.5000 mg | INHALATION_SOLUTION | RESPIRATORY_TRACT | 12 refills | Status: DC | PRN

## 2021-12-17 MED ORDER — ALBUTEROL SULFATE (2.5 MG/3ML) 0.083% IN NEBU
2.5000 mg | INHALATION_SOLUTION | RESPIRATORY_TRACT | Status: DC | PRN
  Administered 2021-12-17: 2.5 mg via RESPIRATORY_TRACT
  Filled 2021-12-17: qty 3

## 2021-12-17 MED ORDER — ALBUTEROL SULFATE (2.5 MG/3ML) 0.083% IN NEBU
2.5000 mg | INHALATION_SOLUTION | RESPIRATORY_TRACT | 12 refills | Status: DC | PRN
  Filled 2021-12-17: qty 90, 5d supply, fill #0

## 2021-12-17 MED ORDER — AZITHROMYCIN 500 MG PO TABS
500.0000 mg | ORAL_TABLET | Freq: Every day | ORAL | 0 refills | Status: AC
  Filled 2021-12-17: qty 2, 2d supply, fill #0

## 2021-12-17 MED ORDER — CARVEDILOL 6.25 MG PO TABS
18.7500 mg | ORAL_TABLET | Freq: Two times a day (BID) | ORAL | 2 refills | Status: DC
  Filled 2021-12-17: qty 180, 30d supply, fill #0

## 2021-12-17 MED ORDER — IOHEXOL 350 MG/ML SOLN
100.0000 mL | Freq: Once | INTRAVENOUS | Status: AC | PRN
  Administered 2021-12-17: 100 mL via INTRAVENOUS

## 2021-12-17 MED ORDER — CEFDINIR 300 MG PO CAPS
300.0000 mg | ORAL_CAPSULE | Freq: Two times a day (BID) | ORAL | 0 refills | Status: AC
  Filled 2021-12-17: qty 8, 4d supply, fill #0

## 2021-12-17 MED ORDER — CARVEDILOL 6.25 MG PO TABS
18.7500 mg | ORAL_TABLET | Freq: Two times a day (BID) | ORAL | Status: DC
  Filled 2021-12-17 (×2): qty 1

## 2021-12-17 MED ORDER — AZITHROMYCIN 500 MG PO TABS: 500.0000 mg | ORAL_TABLET | Freq: Every day | ORAL | 0 refills | Status: DC

## 2021-12-17 MED ORDER — CARVEDILOL 6.25 MG PO TABS: 18.7500 mg | ORAL_TABLET | Freq: Two times a day (BID) | ORAL | 2 refills | Status: DC

## 2021-12-17 NOTE — TOC CM/SW Note (Deleted)
  [place] Medication Assistance Card Name: Rebecca Tapia (MRN): [id number] [place]: [id number] RX Group: [id number] Discharge Date: 12/17/2021 Expiration Date:12/25/2021                                           (must be filled within 7 days of discharge)     You have been approved to have the prescriptions written by your discharging physician filled through our [facility] (Medication Assistance Through [facility]) program. This program allows for a one-time (no refills) 34-day supply of selected medications for a low copay amount.  The copay is $3.00 per prescription. For instance, if you have one prescription, you will pay $3.00; for two prescriptions, you pay $6.00; for three prescriptions, you pay $9.00; and so on.  Only certain pharmacies are participating in this program with [facility]. You will need to select one of the pharmacies from the attached list and take your prescriptions, this letter, and your photo ID to one of the [place] pharmacies, [facility], [facility] [address] or [facility] [address].   We are excited that you are able to use the [facility] program to get your medications. These prescriptions must be filled within 7 days of hospital discharge or they will no longer be valid for the [facility] program. Should you have any problems with your prescriptions please contact your case management team member at [phone] for [place]/ [place] you, [place] [facility]

## 2021-12-17 NOTE — TOC Transition Note (Addendum)
Transition of Care [facility]) - CM/SW Discharge Note   Patient Details  Name: Rebecca Tapia MRN: [id number] Date of Birth: [birth year]  Transition of [facility]) CM/SW Contact:  Ross Ludwig, LCSW Phone Number: 12/17/2021, 4:21 PM   Clinical Narrative:     CSW was informed that patient had questions about Medicaid and paying for her medications.  Patient also does not have a PCP, CSW was able to contact the [name] and get a hospital follow up appointment scheduled for Tuesday Dec 31, 2021 at 11:20 AM.  CSW informed patient that CSW can not assist with applying for Medicaid.  CSW deferred her to contact the [place] and talk to them about applying for Medicaid.  CSW added contact information on the AVS for her to call.  Patient had questions regarding medication cost, CSW contacted the [facility] outpatient pharmacy and patient's medications would only be $41.68.  Per pharmacy it would not be to her advantage to use the [facility] medication assistance program.  CSW also told her that the clinic where she will go for the hospital follow up can help with future medication cost assistance.    CSW was informed by physician that patient will need a nebulizer, CSW contacted Danielle at [place] and they can provide one under charity for her.  CSW updated attending physician.    Patient did not express any other issues or concerns.  CSW signing off, please reconsult if other social work needs arise.   Final next level of care: Home/Self Care Barriers to Discharge: Barriers Resolved   Patient Goals and CMS Choice Patient states their goals for this hospitalization and ongoing recovery are:: To return back home. CMS Medicare.gov Compare Post Acute Care list provided to:: Patient Choice offered to / list presented to : Patient  Discharge Placement                       Discharge Plan and Services                DME Arranged: Nebulizer  machine DME Agency: [employer] Date DME Agency Contacted: 12/17/21 Time DME Agency Contacted: [phone] Representative spoke with at DME Agency: Andee Poles            Social Determinants of Health ([place]) Interventions Housing Interventions: Intervention Not Indicated Transportation Interventions: Intervention Not Indicated Alcohol Usage Interventions: Intervention Not Indicated (Score <7) Financial Strain Interventions: Intervention Not Indicated   Readmission Risk Interventions     No data to display

## 2021-12-17 NOTE — Progress Notes (Addendum)
Rounding Note    Patient Name: Rebecca Tapia Date of Encounter: 12/17/2021  [facility] Cardiologist: Thomasene Ripple, DO   Subjective   Feeling well. No chest pain, sob or palpitations.    Inpatient Medications    Scheduled Meds:  azithromycin  500 mg Oral QHS   carvedilol  18.75 mg Oral BID WC   ipratropium-albuterol  3 mL Nebulization Q6H   Continuous Infusions:  cefTRIAXone (ROCEPHIN)  IV Stopped (12/16/21 2312)   heparin 2,500 Units/hr (12/17/21 0112)   PRN Meds: acetaminophen, albuterol, hydrALAZINE **OR** hydrALAZINE, melatonin, mouth rinse, polyethylene glycol, prochlorperazine   Vital Signs    Vitals:   12/16/21 2046 12/17/21 0538 12/17/21 0540 12/17/21 0731  BP: (!) 142/79 (!) 110/59 139/86   Pulse: (!) 104 99 (!) 103   Resp: 16 (!) 21 18   Temp: 98.1 F (36.7 C) (!) 97.3 F (36.3 C)    TempSrc: Oral Oral    SpO2: 95% 97% 96% 100%  Weight:      Height:        Intake/Output Summary (Last 24 hours) at 12/17/2021 0831 Last data filed at 12/17/2021 0600 Gross per 24 hour  Intake 1393.15 ml  Output 200 ml  Net 1193.15 ml      12/14/2021    8:56 PM 09/19/2013    1:22 PM 09/12/2012   10:01 AM  Last 3 Weights  Weight (lbs) 432 lb 365 lb 330 lb  Weight (kg) 195.954 kg 165.563 kg 149.687 kg      Telemetry    Sinus rhythm/tachycardia HR 90-100s - Personally Reviewed  ECG    N/A  Physical Exam   GEN: No acute distress.   Neck: No JVD Cardiac: RRR, no murmurs, rubs, or gallops.  Respiratory: Clear to auscultation bilaterally. GI: Soft, nontender, non-distended  MS: No edema; No deformity. Neuro:  Nonfocal  Psych: Normal affect   Labs    High Sensitivity Troponin:   Recent Labs  Lab 12/14/21 2121 12/14/21 2318  TROPONINIHS 33* 29*     Chemistry Recent Labs  Lab 12/14/21 2121 12/15/21 0645 12/17/21 0517  NA 138 135 137  K 3.4* 3.8 4.0  CL 108 106 104  CO2 21* 21* 26  GLUCOSE 109* 122* 97  BUN 11 11 19   CREATININE  0.99 0.82 1.08*  CALCIUM 8.6* 8.9 9.0  MG  --  2.2  --   PROT 7.3 8.2* 7.3  ALBUMIN 3.7 3.8 3.6  AST 31 41 27  ALT 37 52* 55*  ALKPHOS 61 66 52  BILITOT 1.3* 1.2 0.5  GFRNONAA >60 >60 >60  ANIONGAP 9 8 7     Lipids No results for input(s): "CHOL", "TRIG", "HDL", "LABVLDL", "LDLCALC", "CHOLHDL" in the last 168 hours.  Hematology Recent Labs  Lab 12/15/21 0645 12/16/21 0449 12/17/21 0517  WBC 7.8 8.3 10.7*  RBC 4.98 4.67 4.60  HGB 14.6 13.5 13.4  HCT 44.4 42.8 42.7  MCV 89.2 91.6 92.8  MCH 29.3 28.9 29.1  MCHC 32.9 31.5 31.4  RDW 12.9 13.2 13.2  PLT 320 304 298   Thyroid No results for input(s): "TSH", "FREET4" in the last 168 hours.  BNP Recent Labs  Lab 12/14/21 2121  BNP 257.0*    DDimer  Recent Labs  Lab 12/15/21 0002  DDIMER 1.63*     Radiology    ECHOCARDIOGRAM COMPLETE  Result Date: 12/15/2021    ECHOCARDIOGRAM REPORT   Patient Name:   Rebecca Tapia Date of Exam: 12/15/2021  Medical Rec #:  [id number]        Height:       72.0 in Accession #:    [id number]       Weight:       432.0 lb Date of Birth:  Oct 17, 1995         BSA:          2.956 m Patient Age:    26 years         BP:           132/72 mmHg Patient Gender: F                HR:           115 bpm. Exam Location:  Inpatient Procedure: 2D Echo, Cardiac Doppler, Color Doppler and Intracardiac            Opacification Agent Indications:    Pulmonary Embolus I26.09  History:        Patient has no prior history of Echocardiogram examinations.  Sonographer:    Eulah Pont RDCS Referring Phys: [id number] Darlin Drop  Sonographer Comments: Patient is obese. IMPRESSIONS  1. Left ventricular ejection fraction, by estimation, is 35 to 40%. The left ventricle has moderately decreased function. The left ventricle demonstrates global hypokinesis. The left ventricular internal cavity size was moderately dilated. Indeterminate  diastolic filling due to E-A fusion.  2. Right ventricular systolic function is normal. The right  ventricular size is normal.  3. Left atrial size was moderately dilated.  4. The mitral valve is normal in structure. Trivial mitral valve regurgitation. No evidence of mitral stenosis.  5. The aortic valve is tricuspid. Aortic valve regurgitation is not visualized. No aortic stenosis is present.  6. The inferior vena cava is normal in size with greater than 50% respiratory variability, suggesting right atrial pressure of 3 mmHg. FINDINGS  Left Ventricle: Left ventricular ejection fraction, by estimation, is 35 to 40%. The left ventricle has moderately decreased function. The left ventricle demonstrates global hypokinesis. Definity contrast agent was given IV to delineate the left ventricular endocardial borders. The left ventricular internal cavity size was moderately dilated. There is no left ventricular hypertrophy. Indeterminate diastolic filling due to E-A fusion. Right Ventricle: The right ventricular size is normal. No increase in right ventricular wall thickness. Right ventricular systolic function is normal. Left Atrium: Left atrial size was moderately dilated. Right Atrium: Right atrial size was normal in size. Pericardium: There is no evidence of pericardial effusion. Mitral Valve: The mitral valve is normal in structure. Trivial mitral valve regurgitation. No evidence of mitral valve stenosis. Tricuspid Valve: The tricuspid valve is normal in structure. Tricuspid valve regurgitation is trivial. No evidence of tricuspid stenosis. Aortic Valve: The aortic valve is tricuspid. Aortic valve regurgitation is not visualized. No aortic stenosis is present. Pulmonic Valve: The pulmonic valve was normal in structure. Pulmonic valve regurgitation is mild. No evidence of pulmonic stenosis. Aorta: The aortic root is normal in size and structure. Venous: The inferior vena cava is normal in size with greater than 50% respiratory variability, suggesting right atrial pressure of 3 mmHg. IAS/Shunts: The interatrial septum  was not well visualized.  LEFT VENTRICLE PLAX 2D LVIDd:         7.20 cm      Diastology LVIDs:         6.00 cm      LV e' medial:  5.03 cm/s LV PW:         1.20  cm      LV e' lateral: 6.65 cm/s LV IVS:        1.10 cm LVOT diam:     2.40 cm LV SV:         49 LV SV Index:   17 LVOT Area:     4.52 cm  LV Volumes (MOD) LV vol d, MOD A2C: 233.0 ml LV vol d, MOD A4C: 265.0 ml LV vol s, MOD A2C: 160.0 ml LV vol s, MOD A4C: 162.0 ml LV SV MOD A2C:     73.0 ml LV SV MOD A4C:     265.0 ml LV SV MOD BP:      89.5 ml RIGHT VENTRICLE RV S prime:     16.10 cm/s TAPSE (M-mode): 2.2 cm LEFT ATRIUM              Index        RIGHT ATRIUM           Index LA diam:        5.60 cm  1.89 cm/m   RA Area:     18.10 cm LA Vol (A2C):   153.0 ml 51.77 ml/m  RA Volume:   51.10 ml  17.29 ml/m LA Vol (A4C):   140.0 ml 47.37 ml/m LA Biplane Vol: 152.0 ml 51.43 ml/m  AORTIC VALVE LVOT Vmax:   83.60 cm/s LVOT Vmean:  53.300 cm/s LVOT VTI:    0.108 m  AORTA Ao Root diam: 3.50 cm Ao Asc diam:  3.70 cm MR Peak grad: 90.6 mmHg MR Mean grad: 61.0 mmHg   SHUNTS MR Vmax:      476.00 cm/s Systemic VTI:  0.11 m MR Vmean:     369.0 cm/s  Systemic Diam: 2.40 cm Charlton Haws MD Electronically signed by Charlton Haws MD Signature Date/Time: 12/15/2021/5:24:18 PM    Final    VAS [place] LOWER EXTREMITY VENOUS (DVT)  Result Date: 12/15/2021  Lower Venous DVT Study Patient Name:  Rebecca Tapia  Date of Exam:   12/15/2021 Medical Rec #: [id number]         Accession #:    [id number] Date of Birth: 12/14/1995          Patient Gender: F Patient Age:   45 years Exam Location:  [facility] Procedure:      VAS [place] LOWER EXTREMITY VENOUS (DVT) Referring Phys: Enid Derry HALL --------------------------------------------------------------------------------  Indications: Swelling.  Risk Factors: None identified. Limitations: Body habitus and poor ultrasound/tissue interface. Comparison Study: No prior studies. Performing Technologist: Chanda Busing RVT   Examination Guidelines: A complete evaluation includes B-mode imaging, spectral Doppler, color Doppler, and power Doppler as needed of all accessible portions of each vessel. Bilateral testing is considered an integral part of a complete examination. Limited examinations for reoccurring indications may be performed as noted. The reflux portion of the exam is performed with the patient in reverse Trendelenburg.  +---------+---------------+---------+-----------+----------+--------------+ RIGHT    CompressibilityPhasicitySpontaneityPropertiesThrombus Aging +---------+---------------+---------+-----------+----------+--------------+ CFV      Full           Yes      Yes                                 +---------+---------------+---------+-----------+----------+--------------+ SFJ      Full                                                        +---------+---------------+---------+-----------+----------+--------------+  FV Prox  Full                                                        +---------+---------------+---------+-----------+----------+--------------+ FV Mid   Full                                                        +---------+---------------+---------+-----------+----------+--------------+ FV Distal               Yes      Yes                                 +---------+---------------+---------+-----------+----------+--------------+ PFV      Full                                                        +---------+---------------+---------+-----------+----------+--------------+ POP      Full           Yes      Yes                                 +---------+---------------+---------+-----------+----------+--------------+ PTV      Full                                                        +---------+---------------+---------+-----------+----------+--------------+ PERO     Full                                                         +---------+---------------+---------+-----------+----------+--------------+   +---------+---------------+---------+-----------+----------+--------------+ LEFT     CompressibilityPhasicitySpontaneityPropertiesThrombus Aging +---------+---------------+---------+-----------+----------+--------------+ CFV      Full           Yes      Yes                                 +---------+---------------+---------+-----------+----------+--------------+ SFJ      Full                                                        +---------+---------------+---------+-----------+----------+--------------+ FV Prox  Full                                                        +---------+---------------+---------+-----------+----------+--------------+  FV Mid   Full                                                        +---------+---------------+---------+-----------+----------+--------------+ FV Distal               Yes      Yes                                 +---------+---------------+---------+-----------+----------+--------------+ PFV      Full                                                        +---------+---------------+---------+-----------+----------+--------------+ POP      Full           Yes      Yes                                 +---------+---------------+---------+-----------+----------+--------------+ PTV      Full                                                        +---------+---------------+---------+-----------+----------+--------------+ PERO     Full                                                        +---------+---------------+---------+-----------+----------+--------------+     Summary: RIGHT: - There is no evidence of deep vein thrombosis in the lower extremity. However, portions of this examination were limited- see technologist comments above.  - No cystic structure found in the popliteal fossa.  LEFT: - There is no evidence of deep vein  thrombosis in the lower extremity. However, portions of this examination were limited- see technologist comments above.  - No cystic structure found in the popliteal fossa.  *See table(s) above for measurements and observations. Electronically signed by Heath Lark on 12/15/2021 at 2:47:03 PM.    Final     Cardiac Studies   Echo 12/15/21  1. Left ventricular ejection fraction, by estimation, is 35 to 40%. The  left ventricle has moderately decreased function. The left ventricle  demonstrates global hypokinesis. The left ventricular internal cavity size  was moderately dilated. Indeterminate   diastolic filling due to E-A fusion.   2. Right ventricular systolic function is normal. The right ventricular  size is normal.   3. Left atrial size was moderately dilated.   4. The mitral valve is normal in structure. Trivial mitral valve  regurgitation. No evidence of mitral stenosis.   5. The aortic valve is tricuspid. Aortic valve regurgitation is not  visualized. No aortic stenosis is present.   6. The inferior vena cava is normal in size with greater than 50%  respiratory variability,  suggesting right atrial pressure of 3 mmHg.   Patient Profile     26 y.o. female  with no significant past medical history except morbid obesity with BMI of 58 who is being seen 12/16/2021 for the evaluation of reduced LV function in setting of acute hypoxic respiratory failure 2nd to PNA and suspected PE at the request of Dr. Lonny Prude.  Assessment & Plan    HFrEF - Echoardiogram showed reduced LV function at 35 to 40%, global hypokinesis, indeterminate diastolic filling due to E-A fusion.  Normal RV function. -Appears euvolemic on exam -Likely nonischemic cardiomyopathy from possible viral illness -BP stable this morning >> increase carvedilol to 18.75 mg twice daily. - Hold adding Losartan 12.5mg  qd today given slight bump in Scr and plan for repeat CTA. However consider adding prior to discharge if stable  renal function otherwise will add during follow up.  - Add other GDMT as outpatient  -Could consider cardiac MRI versus repeat echocardiogram in 2-3 months    2.  Acute hypoxic respiratory failure secondary to multifocal pneumonia and possible pulmonary embolism -Undergoing treatment and evaluation by primary team -  Normal RV function by echo  Will sign off. Meds as summarized above. Follow up in HF clinic 12/27/21.   For questions or updates, please contact [place] Please consult [email or web address] for contact info under   Signed, Leanor Kail, PA  12/17/2021, 8:31 AM     [place] will sign off.   Medication Recommendations:  coreg 18.75 mg BID, Will add Entresto or ARB, Aldactone and SGLT 2 Inhibitors in the outpatient setting  Other recommendations (labs, testing, etc):  cardiac MR/repeat echo  Follow up as an outpatient:  With [facility]

## 2021-12-17 NOTE — Discharge Summary (Signed)
Physician Discharge Summary   Patient: Rebecca Tapia MRN: [id number] DOB: [birth year]  Admit date:     12/14/2021  Discharge date: 12/17/21  Discharge Physician: Jacquelin Hawking, MD   PCP: Patient, No Pcp Per   Recommendations at discharge:  PCP and cardiology follow-up Continue antibiotics Repeat chest x-ray in 3-4 weeks Recheck BMP and CBC at outpatient follow-up  Discharge Diagnoses: Principal Problem:   Multifocal pneumonia Active Problems:   HFrEF (heart failure with reduced ejection fraction) (HCC)   Demand ischemia   Hypokalemia   Morbid obesity with BMI of 50.0-59.9, adult (HCC)  Resolved Problems:   Acute respiratory failure with hypoxia (HCC)   Prolonged QT interval  Hospital Course: [name] is a 26 y.o. female with a history of morbid obesity and marijuana use. Patient is on birth control with Mirena IUD in place. Patient presented secondary to progressive shortness of breath with associated non-productive cough. CTA imaging suggested possible PE and was concerning for multifocal pneumonia. Patient started on empiric antibiotics and Heparin IV in addition to supplemental oxygen for associated hypoxia and Solu-medrol/Duoneb for some wheezing. Lower extremity venous duplex was negative for acute DVT. Transthoracic Echocardiogram obtained and did not show evidence of RV strain, but was significant for newly diagnosed dilated cardiomyopathy. Cardiology consulted with plan for outpatient follow-up. Patient started on Coreg. Repeat CTA imaging performed on day of discharge which was complicated by motion artifact but was at least significant for no centrally located blood clot. Decision made to discontinue anticoagulation and treat acute process as related to bilateral pneumonia. Patient to follow-up with newly established PCP and cardiologist.  Assessment and Plan:  Acute respiratory failure with hypoxia Secondary to pneumonia in addition to possible PE. Patient  requiring 2 L/min of supplemental oxygen. Patient also started on Solu-medrol and Duoneb nebulizer treatments.   Multifocal pneumonia Noted on CTA chest. Recently negative COVID-19 and influenza testing. No URI symptoms. Associated hypoxia and dyspnea on exertion. Patient started empirically on Ceftriaxone and azithromycin. Procalcitonin undetectable. RVP negative. Procalcitonin negative. Mild leukocytosis in setting of steroid use. Transition to Cefdinir and azithromycin on discharge. Also discharged with a nebulizer machine and albuterol solution.   Heart failure with reduced EF Newly diagnosed without clinical evidence of acute failure. LVEF of 35-40% with associated global hypokinesis. Started on Coreg. Cardiology consulted and plan for goal directed medical therapy and possible outpatient cardiac MRI vs repeat Transthoracic Echocardiogram. Patient discharged on Coreg 18.75 mg.   Demand ischemia Mildly elevated. Likely secondary to demand ischemia. No chest pain. EKG non-ischemic. Transthoracic Echocardiogram negative for right heart strain.   Abnormal CTA chest Possible filling defect vs artifact on CTA chest without ability to rule out PE. No chest pain. Tachycardia. No dyspnea at rest but present with exertion. Patient is on progestin only IUD. Patient started empirically on Heparin IV. LE venous duplex negative for VTE. Unable to obtain V/Q scan secondary to weight limit. Transthoracic Echocardiogram without RV failure. Repeat CTA chest without evidence of centrally located PE but also with severe motion artifact. After discussion with radiologist, PE seems less likely as filling defect is most likely artifact in addition to there being a reason for presentation with known bilateral pneumonia. Discussed with patient as well and decision made to discontinue heparin IV and to discharge without anticoagulation.   Prolonged QTc Appears to have resolved on telemetry.   Hypokalemia Mild with  potassium of 3.4. Resolved.   Non-anion gap metabolic acidosis Mild  Leukocytosis Mild and likely secondary  to steroids.   Hyperbilirubinemia Minimally elevated. Trended down. Resolved.   Morbid obesity Estimated body mass index is 58.59 kg/m as calculated from the following:   Height as of this encounter: 6' (1.829 m).   Weight as of this encounter: 196 kg.    Consultants: Cardiology Procedures performed: Transthoracic Echocardiogram   Disposition: Home Diet recommendation: Heart healthy/low sodium   DISCHARGE MEDICATION: Allergies as of 12/17/2021       Reactions   Bee Venom Anaphylaxis        Medication List     STOP taking these medications    ibuprofen 200 MG tablet Commonly known as: ADVIL       TAKE these medications    albuterol (2.5 MG/3ML) 0.083% nebulizer solution Commonly known as: PROVENTIL Take 3 mLs (2.5 mg total) by nebulization every 4 (four) hours as needed for wheezing or shortness of breath.   azithromycin 500 MG tablet Commonly known as: ZITHROMAX Take 1 tablet (500 mg total) by mouth daily for 2 days.   carvedilol 6.25 MG tablet Commonly known as: COREG Take 3 tablets (18.75 mg total) by mouth 2 (two) times daily with a meal.   cefdinir 300 MG capsule Commonly known as: OMNICEF Take 1 capsule (300 mg total) by mouth 2 (two) times daily for 4 days.   levonorgestrel 20 MCG/24HR IUD Commonly known as: MIRENA 1 each by Intrauterine route once.   multivitamin with minerals tablet Take 1 tablet by mouth daily.               Durable Medical Equipment  (From admission, onward)           Start     Ordered   12/17/21 1547  For home use only DME Nebulizer machine  Once       Question Answer Comment  Patient needs a nebulizer to treat with the following condition Multifocal pneumonia   Length of Need 6 Months      12/17/21 1546            Follow-up Information     [facility]. Go in 11 day(s).   Specialty: Cardiology Why: Hospital follow up PLEASE bring a current medication list to appointment FREE valet parking, Entrance C, off [employer] information: [address] [id number] mc [place] [address] [phone]        Health, [facility] Follow up.   Specialty: Home Health Services Why: Call to discuss how to apply for Medicaid. Contact information: [address] [place] [address] [phone]                Discharge Exam: BP 120/87 (BP Location: Left Arm)   Pulse 100   Temp 98.1 F (36.7 C) (Oral)   Resp 18   Ht 6' (1.829 m)   Wt (!) 196 kg   SpO2 92%   BMI 58.59 kg/m   General exam: Appears calm and comfortable Respiratory system: Clear to auscultation. Respiratory effort normal. Cardiovascular system: S1 & S2 heard, RRR. No murmurs, rubs, gallops or clicks. Gastrointestinal system: Abdomen is nondistended, soft and nontender. Normal bowel sounds heard. Central nervous system: Alert and oriented. No focal neurological deficits. Musculoskeletal: Trace edema. No calf tenderness Skin: No cyanosis. No rashes Psychiatry: Judgement and insight appear normal. Mood & affect appropriate.   Condition at discharge: stable  The results of significant diagnostics from this hospitalization (including imaging, microbiology, ancillary and laboratory) are listed  below for reference.   Imaging Studies: CT Angio Chest Pulmonary Embolism (PE) W or WO Contrast  Result Date: 12/17/2021 CLINICAL DATA:  Pulmonary embolism (PE) suspected, high prob EXAM: CT ANGIOGRAPHY CHEST WITH CONTRAST TECHNIQUE: Multidetector CT imaging of the chest was performed using the standard protocol during bolus administration of intravenous contrast. Multiplanar CT image reconstructions and MIPs were obtained to evaluate the vascular anatomy. RADIATION DOSE REDUCTION: This exam was  performed according to the departmental dose-optimization program which includes automated exposure control, adjustment of the mA and/or kV according to patient size and/or use of iterative reconstruction technique. CONTRAST:  OMNIPAQUE IOHEXOL 350 MG/ML SOLN COMPARISON:  CT chest 12/14/2021. FINDINGS: Cardiovascular: Evaluation of the pulmonary arteries is significantly limited due to the presence of respiratory motion artifact and body habitus. There is adequate opacification of the pulmonary trunk and proximal right and left pulmonary arteries. The lobar, segmental, and subsegmental arteries are artifactually obscured. There is no evidence of central/saddle PE. Mildly enlarged main pulmonary artery. The thoracic aorta is unremarkable. Cardiomegaly. No pericardial disease. Mediastinum/Nodes: There is mediastinal and hilar lymphadenopathy, likely reactive. The thyroid is unremarkable. Esophagus is unremarkable. Lungs/Pleura: There is multifocal airspace disease bilaterally in the upper and lower lobes, relatively sparing the right middle lobe. Distribution of airspace disease is similar in comparison to recent CT, with slight improvement in the superior segments of the lower lobes. No pleural effusion or pneumothorax. Upper Abdomen: No acute findings. Musculoskeletal: No acute osseous abnormality. No suspicious osseous lesion. Review of the MIP images confirms the above findings. IMPRESSION: Poor evaluation of the pulmonary arteries due to respiratory motion artifact and body habitus. Within this limitation, there is no evidence of central/saddle PE. Multifocal pneumonia, with slight improvement in the superior segments of the lower lobes. Mediastinal and hilar lymphadenopathy, likely reactive. Cardiomegaly. Mildly dilated main pulmonary artery, can be seen in the setting of pulmonary hypertension. Electronically Signed   By: Caprice Renshaw M.D.   On: 12/17/2021 13:57   ECHOCARDIOGRAM COMPLETE  Result Date:  12/15/2021    ECHOCARDIOGRAM REPORT   Patient Name:   JAVAYAH MAGAW Date of Exam: 12/15/2021 Medical Rec #:  [id number]        Height:       72.0 in Accession #:    [id number]       Weight:       432.0 lb Date of Birth:  [birth year]         BSA:          2.956 m Patient Age:    26 years         BP:           132/72 mmHg Patient Gender: F                HR:           115 bpm. Exam Location:  Inpatient Procedure: 2D Echo, Cardiac Doppler, Color Doppler and Intracardiac            Opacification Agent Indications:    Pulmonary Embolus I26.09  History:        Patient has no prior history of Echocardiogram examinations.  Sonographer:    Eulah Pont RDCS Referring Phys: [id number] Darlin Drop  Sonographer Comments: Patient is obese. IMPRESSIONS  1. Left ventricular ejection fraction, by estimation, is 35 to 40%. The left ventricle has moderately decreased function. The left ventricle demonstrates global hypokinesis. The left ventricular internal cavity size was moderately dilated. Indeterminate  diastolic filling due to E-A fusion.  2. Right ventricular systolic function is normal. The right ventricular size is normal.  3. Left atrial size was moderately dilated.  4. The mitral valve is normal in structure. Trivial mitral valve regurgitation. No evidence of mitral stenosis.  5. The aortic valve is tricuspid. Aortic valve regurgitation is not visualized. No aortic stenosis is present.  6. The inferior vena cava is normal in size with greater than 50% respiratory variability, suggesting right atrial pressure of 3 mmHg. FINDINGS  Left Ventricle: Left ventricular ejection fraction, by estimation, is 35 to 40%. The left ventricle has moderately decreased function. The left ventricle demonstrates global hypokinesis. Definity contrast agent was given IV to delineate the left ventricular endocardial borders. The left ventricular internal cavity size was moderately dilated. There is no left ventricular hypertrophy. Indeterminate  diastolic filling due to E-A fusion. Right Ventricle: The right ventricular size is normal. No increase in right ventricular wall thickness. Right ventricular systolic function is normal. Left Atrium: Left atrial size was moderately dilated. Right Atrium: Right atrial size was normal in size. Pericardium: There is no evidence of pericardial effusion. Mitral Valve: The mitral valve is normal in structure. Trivial mitral valve regurgitation. No evidence of mitral valve stenosis. Tricuspid Valve: The tricuspid valve is normal in structure. Tricuspid valve regurgitation is trivial. No evidence of tricuspid stenosis. Aortic Valve: The aortic valve is tricuspid. Aortic valve regurgitation is not visualized. No aortic stenosis is present. Pulmonic Valve: The pulmonic valve was normal in structure. Pulmonic valve regurgitation is mild. No evidence of pulmonic stenosis. Aorta: The aortic root is normal in size and structure. Venous: The inferior vena cava is normal in size with greater than 50% respiratory variability, suggesting right atrial pressure of 3 mmHg. IAS/Shunts: The interatrial septum was not well visualized.  LEFT VENTRICLE PLAX 2D LVIDd:         7.20 cm      Diastology LVIDs:         6.00 cm      LV e' medial:  5.03 cm/s LV PW:         1.20 cm      LV e' lateral: 6.65 cm/s LV IVS:        1.10 cm LVOT diam:     2.40 cm LV SV:         49 LV SV Index:   17 LVOT Area:     4.52 cm  LV Volumes (MOD) LV vol d, MOD A2C: 233.0 ml LV vol d, MOD A4C: 265.0 ml LV vol s, MOD A2C: 160.0 ml LV vol s, MOD A4C: 162.0 ml LV SV MOD A2C:     73.0 ml LV SV MOD A4C:     265.0 ml LV SV MOD BP:      89.5 ml RIGHT VENTRICLE RV S prime:     16.10 cm/s TAPSE (M-mode): 2.2 cm LEFT ATRIUM              Index        RIGHT ATRIUM           Index LA diam:        5.60 cm  1.89 cm/m   RA Area:     18.10 cm LA Vol (A2C):   153.0 ml 51.77 ml/m  RA Volume:   51.10 ml  17.29 ml/m LA Vol (A4C):   140.0 ml 47.37 ml/m LA Biplane Vol: 152.0 ml  51.43 ml/m  AORTIC VALVE LVOT Vmax:  83.60 cm/s LVOT Vmean:  53.300 cm/s LVOT VTI:    0.108 m  AORTA Ao Root diam: 3.50 cm Ao Asc diam:  3.70 cm MR Peak grad: 90.6 mmHg MR Mean grad: 61.0 mmHg   SHUNTS MR Vmax:      476.00 cm/s Systemic VTI:  0.11 m MR Vmean:     369.0 cm/s  Systemic Diam: 2.40 cm Charlton Haws MD Electronically signed by Charlton Haws MD Signature Date/Time: 12/15/2021/5:24:18 PM    Final    VAS [place] LOWER EXTREMITY VENOUS (DVT)  Result Date: 12/15/2021  Lower Venous DVT Study Patient Name:  ATALAYA ZAPPIA  Date of Exam:   12/15/2021 Medical Rec #: [id number]         Accession #:    [id number] Date of Birth: Jan 03, 1996          Patient Gender: F Patient Age:   33 years Exam Location:  [facility] Procedure:      VAS [place] LOWER EXTREMITY VENOUS (DVT) Referring Phys: Enid Derry HALL --------------------------------------------------------------------------------  Indications: Swelling.  Risk Factors: None identified. Limitations: Body habitus and poor ultrasound/tissue interface. Comparison Study: No prior studies. Performing Technologist: Chanda Busing RVT  Examination Guidelines: A complete evaluation includes B-mode imaging, spectral Doppler, color Doppler, and power Doppler as needed of all accessible portions of each vessel. Bilateral testing is considered an integral part of a complete examination. Limited examinations for reoccurring indications may be performed as noted. The reflux portion of the exam is performed with the patient in reverse Trendelenburg.  +---------+---------------+---------+-----------+----------+--------------+ RIGHT    CompressibilityPhasicitySpontaneityPropertiesThrombus Aging +---------+---------------+---------+-----------+----------+--------------+ CFV      Full           Yes      Yes                                 +---------+---------------+---------+-----------+----------+--------------+ SFJ      Full                                                         +---------+---------------+---------+-----------+----------+--------------+ FV Prox  Full                                                        +---------+---------------+---------+-----------+----------+--------------+ FV Mid   Full                                                        +---------+---------------+---------+-----------+----------+--------------+ FV Distal               Yes      Yes                                 +---------+---------------+---------+-----------+----------+--------------+ PFV      Full                                                        +---------+---------------+---------+-----------+----------+--------------+  POP      Full           Yes      Yes                                 +---------+---------------+---------+-----------+----------+--------------+ PTV      Full                                                        +---------+---------------+---------+-----------+----------+--------------+ PERO     Full                                                        +---------+---------------+---------+-----------+----------+--------------+   +---------+---------------+---------+-----------+----------+--------------+ LEFT     CompressibilityPhasicitySpontaneityPropertiesThrombus Aging +---------+---------------+---------+-----------+----------+--------------+ CFV      Full           Yes      Yes                                 +---------+---------------+---------+-----------+----------+--------------+ SFJ      Full                                                        +---------+---------------+---------+-----------+----------+--------------+ FV Prox  Full                                                        +---------+---------------+---------+-----------+----------+--------------+ FV Mid   Full                                                         +---------+---------------+---------+-----------+----------+--------------+ FV Distal               Yes      Yes                                 +---------+---------------+---------+-----------+----------+--------------+ PFV      Full                                                        +---------+---------------+---------+-----------+----------+--------------+ POP      Full           Yes      Yes                                 +---------+---------------+---------+-----------+----------+--------------+  PTV      Full                                                        +---------+---------------+---------+-----------+----------+--------------+ PERO     Full                                                        +---------+---------------+---------+-----------+----------+--------------+     Summary: RIGHT: - There is no evidence of deep vein thrombosis in the lower extremity. However, portions of this examination were limited- see technologist comments above.  - No cystic structure found in the popliteal fossa.  LEFT: - There is no evidence of deep vein thrombosis in the lower extremity. However, portions of this examination were limited- see technologist comments above.  - No cystic structure found in the popliteal fossa.  *See table(s) above for measurements and observations. Electronically signed by Heath Lark on 12/15/2021 at 2:47:03 PM.    Final    CT Angio Chest PE W and/or Wo Contrast  Result Date: 12/14/2021 CLINICAL DATA:  Pulmonary embolism suspected, high probability. Shortness of breath for 1 week. EXAM: CT ANGIOGRAPHY CHEST WITH CONTRAST TECHNIQUE: Multidetector CT imaging of the chest was performed using the standard protocol during bolus administration of intravenous contrast. Multiplanar CT image reconstructions and MIPs were obtained to evaluate the vascular anatomy. RADIATION DOSE REDUCTION: This exam was performed according to the departmental  dose-optimization program which includes automated exposure control, adjustment of the mA and/or kV according to patient size and/or use of iterative reconstruction technique. CONTRAST:  80mL OMNIPAQUE IOHEXOL 350 MG/ML SOLN COMPARISON:  None Available. FINDINGS: Cardiovascular: The heart is enlarged and there is no pericardial effusion. The aorta is normal in caliber. The pulmonary trunk is distended which may be associated with underlying pulmonary artery hypertension. Examination of the pulmonary arteries is extremely limited due to patient's body habitus and respiratory motion artifact. There is a possible pulmonary embolus in the left lower lobe lobar, segmental and subsegmental arteries versus artifact. Evaluation of the main right pulmonary artery is limited due to artifact. Mediastinum/Nodes: There suspected prominent lymph nodes in the mediastinum measuring up to 1 cm. Prominent lymph nodes are noted at the right hilum. No axillary lymphadenopathy. The thyroid gland, trachea, and esophagus are within normal limits. Lungs/Pleura: Multifocal airspace opacities are noted in the lungs bilaterally no effusion or pneumothorax. Upper Abdomen: No acute abnormality. Musculoskeletal: No acute osseous abnormality. Review of the MIP images confirms the above findings. IMPRESSION: 1. Examination of the pulmonary arteries is significantly limited due to respiratory motion artifact and patient's body habitus. There is a possible left lower lobe lobar, and segmental pulmonary artery filling defect versus artifact. Significant artifact is also noted in the region of the right main pulmonary artery in the possibility of underlying embolus can not be excluded. Consider short-term repeat follow-up exam or V/Q scan. 2. Scattered airspace opacities in the lungs bilaterally, suspicious for multifocal pneumonia. 3. Distended pulmonary trunk suggesting underlying pulmonary artery hypertension. 4. Cardiomegaly. Electronically Signed    By: Thornell Sartorius M.D.   On: 12/14/2021 22:46   DG Chest 2 View  Result Date: 12/14/2021  CLINICAL DATA:  Shortness of breath, cough EXAM: CHEST - 2 VIEW COMPARISON:  Prior chest x-ray 09/19/2013 FINDINGS: Enlargement of the cardiopericardial silhouette. Diffuse interstitial prominence bilaterally most suggestive of pulmonary edema. No large effusion or pneumothorax. Overall, limited evaluation secondary to patient body habitus and poor penetration. No acute osseous abnormality. IMPRESSION: 1. Cardiomegaly and diffuse interstitial airspace opacities bilaterally. Findings are favored to reflect pulmonary edema. However, acute viral or atypical respiratory infection could appear similar. 2. Very limited chest x-ray due to patient body habitus and limited penetration. Electronically Signed   By: Malachy Moan M.D.   On: 12/14/2021 07:40    Microbiology: Results for orders placed or performed during the hospital encounter of 12/14/21  Respiratory (~20 pathogens) panel by PCR     Status: None   Collection Time: 12/15/21  7:55 PM   Specimen: Nasopharyngeal Swab; Respiratory  Result Value Ref Range Status   Adenovirus NOT DETECTED NOT DETECTED Final   Coronavirus 229E NOT DETECTED NOT DETECTED Final    Comment: (NOTE) The Coronavirus on the Respiratory Panel, DOES NOT test for the novel  Coronavirus (2019 nCoV)    Coronavirus HKU1 NOT DETECTED NOT DETECTED Final   Coronavirus NL63 NOT DETECTED NOT DETECTED Final   Coronavirus OC43 NOT DETECTED NOT DETECTED Final   Metapneumovirus NOT DETECTED NOT DETECTED Final   Rhinovirus / Enterovirus NOT DETECTED NOT DETECTED Final   Influenza A NOT DETECTED NOT DETECTED Final   Influenza B NOT DETECTED NOT DETECTED Final   Parainfluenza Virus 1 NOT DETECTED NOT DETECTED Final   Parainfluenza Virus 2 NOT DETECTED NOT DETECTED Final   Parainfluenza Virus 3 NOT DETECTED NOT DETECTED Final   Parainfluenza Virus 4 NOT DETECTED NOT DETECTED Final    Respiratory Syncytial Virus NOT DETECTED NOT DETECTED Final   Bordetella pertussis NOT DETECTED NOT DETECTED Final   Bordetella Parapertussis NOT DETECTED NOT DETECTED Final   Chlamydophila pneumoniae NOT DETECTED NOT DETECTED Final   Mycoplasma pneumoniae NOT DETECTED NOT DETECTED Final    Comment: Performed at [facility], [address] [place], [place] [address]  Expectorated Sputum Assessment w Gram Stain, Rflx to Resp Cult     Status: None   Collection Time: 12/17/21  1:29 PM   Specimen: Sputum  Result Value Ref Range Status   Specimen Description SPUTUM  Final   Special Requests NONE  Final   Sputum evaluation   Final    THIS SPECIMEN IS ACCEPTABLE FOR SPUTUM CULTURE Performed at [facility], [address], [place], [place] [address]    Report Status 12/17/2021 FINAL  Final    Labs: CBC: Recent Labs  Lab 12/14/21 2121 12/15/21 0645 12/16/21 0449 12/17/21 0517  WBC 10.0 7.8 8.3 10.7*  NEUTROABS 5.7 6.8  --   --   HGB 14.0 14.6 13.5 13.4  HCT 42.3 44.4 42.8 42.7  MCV 88.1 89.2 91.6 92.8  PLT 312 320 304 298   Basic Metabolic Panel: Recent Labs  Lab 12/14/21 2121 12/15/21 0645 12/17/21 0517  NA 138 135 137  K 3.4* 3.8 4.0  CL 108 106 104  CO2 21* 21* 26  GLUCOSE 109* 122* 97  BUN 11 11 19   CREATININE 0.99 0.82 1.08*  CALCIUM 8.6* 8.9 9.0  MG  --  2.2  --   PHOS  --  4.2  --    Liver Function Tests: Recent Labs  Lab 12/14/21 2121 12/15/21 0645 12/17/21 0517  AST 31 41 27  ALT 37 52* 55*  ALKPHOS 61 66 52  BILITOT 1.3* 1.2 0.5  PROT 7.3 8.2* 7.3  ALBUMIN 3.7 3.8 3.6    Discharge time spent: 35 minutes.  Signed: Cordelia Poche, MD [facility] 12/17/2021

## 2021-12-17 NOTE — Discharge Instructions (Addendum)
Rebecca Tapia,  You were in the hospital with trouble breathing and were found to have a bilateral pneumonia. You have improved with antibiotics. You were also found to have a possible blood clot in your lung. This appears to have possibly not been the case but it is hard to have a definitive answer. Your repeat scan did not show any evidence of life threatening blood clot and your legs were clear of a blood clot. For now, recommendation is for no blood thinner. While you were getting your workup, you were found to have a weakened ventricle of your heart (the part of your heart that pumps blood to your organs). The cardiologist will follow-up with you as an outpatient and you have been started on Coreg to help with your heart and blood pressure. Please continue your antibiotics as prescribed and follow-up with your new patient appointment for a PCP.

## 2021-12-17 NOTE — Progress Notes (Signed)
SATURATION QUALIFICATIONS: (This note is used to comply with regulatory documentation for home oxygen)  Patient Saturations on Room Air at Rest = 95%  Patient Saturations on Room Air while Ambulating = 90%  Patient Saturations on 0 Liters of oxygen while Ambulating = 90%  Please briefly explain why patient needs home oxygen: Pt 's O2 decreased to 90% but quickly recovered to 95% on RA with rest.  Jerene Pitch

## 2021-12-17 NOTE — Plan of Care (Signed)
  Problem: Education: Goal: Knowledge of General Education information will improve Description Including pain rating scale, medication(s)/side effects and non-pharmacologic comfort measures Outcome: Progressing   Problem: Health Behavior/Discharge Planning: Goal: Ability to manage health-related needs will improve Outcome: Progressing   

## 2021-12-17 NOTE — Progress Notes (Signed)
ANTICOAGULATION CONSULT NOTE - Follow Up Consult  Pharmacy Consult for Heparin Indication: suspected pulmonary embolus  Allergies  Allergen Reactions   Bee Venom Anaphylaxis    Patient Measurements: Height: 6' (182.9 cm) Weight: (!) 196 kg (432 lb) IBW/kg (Calculated) : 73.1 Heparin Dosing Weight: 125 kg  Vital Signs: Temp: 97.3 F (36.3 C) (10/31 0538) Temp Source: Oral (10/31 0538) BP: 139/86 (10/31 0540) Pulse Rate: 103 (10/31 0540)  Labs: Recent Labs    12/14/21 2121 12/14/21 2121 12/14/21 2318 12/15/21 0645 12/15/21 1426 12/16/21 0449 12/17/21 0517  HGB 14.0  --   --  14.6  --  13.5 13.4  HCT 42.3  --   --  44.4  --  42.8 42.7  PLT 312  --   --  320  --  304 298  HEPARINUNFRC  --    < >  --  0.16* 0.34 0.40 0.43  CREATININE 0.99  --   --  0.82  --   --  1.08*  TROPONINIHS 33*  --  29*  --   --   --   --    < > = values in this interval not displayed.     Estimated Creatinine Clearance: 152.4 mL/min (A) (by C-G formula based on SCr of 1.08 mg/dL (H)).   Medications:  Infusions:   cefTRIAXone (ROCEPHIN)  IV Stopped (12/16/21 2312)   heparin 2,500 Units/hr (12/17/21 0112)    Assessment: 47 yoF presented to ED on 10/28 and was admitted for SOB x10 days.  CT was inconclusive but suggestive of PE.  Pharmacy is consulted to dose heparin IV.  10/29 LE dopplers: negative for DVT, but exam limited by body habitus.   Today, 12/17/2021: Heparin level 0.43, remains therapeutic  CBC: stable, WNL No bleeding or complications reported   Goal of Therapy:  Heparin level 0.3-0.7 units/ml Monitor platelets by anticoagulation protocol: Yes   Plan:  Continue heparin IV infusion at 2500 units/hr Daily heparin level and CBC F/u long-term anticoagulation plans.   Gretta Arab PharmD, BCPS Clinical Pharmacist [facility] main pharmacy [phone] 12/17/2021 7:04 AM

## 2021-12-20 LAB — CULTURE, RESPIRATORY W GRAM STAIN

## 2021-12-24 ENCOUNTER — Encounter (HOSPITAL_COMMUNITY): Payer: Self-pay

## 2021-12-24 ENCOUNTER — Emergency Department (HOSPITAL_BASED_OUTPATIENT_CLINIC_OR_DEPARTMENT_OTHER): Payer: Medicaid Other | Admitting: Radiology

## 2021-12-24 ENCOUNTER — Inpatient Hospital Stay (HOSPITAL_BASED_OUTPATIENT_CLINIC_OR_DEPARTMENT_OTHER)
Admission: EM | Admit: 2021-12-24 | Discharge: 2021-12-27 | DRG: 291 | Disposition: A | Discharge status: Home / Self Care | Payer: Medicaid Other | Attending: Internal Medicine | Admitting: Internal Medicine

## 2021-12-24 ENCOUNTER — Other Ambulatory Visit: Payer: Self-pay

## 2021-12-24 ENCOUNTER — Emergency Department (HOSPITAL_BASED_OUTPATIENT_CLINIC_OR_DEPARTMENT_OTHER): Payer: Medicaid Other

## 2021-12-24 DIAGNOSIS — Z20822 Contact with and (suspected) exposure to covid-19: Secondary | ICD-10-CM | POA: Diagnosis present

## 2021-12-24 DIAGNOSIS — Z597 Insufficient social insurance and welfare support: Secondary | ICD-10-CM

## 2021-12-24 DIAGNOSIS — J9601 Acute respiratory failure with hypoxia: Secondary | ICD-10-CM | POA: Diagnosis present

## 2021-12-24 DIAGNOSIS — I420 Dilated cardiomyopathy: Secondary | ICD-10-CM | POA: Diagnosis present

## 2021-12-24 DIAGNOSIS — Z6841 Body Mass Index (BMI) 40.0 and over, adult: Secondary | ICD-10-CM

## 2021-12-24 DIAGNOSIS — I5023 Acute on chronic systolic (congestive) heart failure: Secondary | ICD-10-CM | POA: Diagnosis present

## 2021-12-24 DIAGNOSIS — I110 Hypertensive heart disease with heart failure: Secondary | ICD-10-CM | POA: Diagnosis present

## 2021-12-24 DIAGNOSIS — Z79899 Other long term (current) drug therapy: Secondary | ICD-10-CM

## 2021-12-24 DIAGNOSIS — I509 Heart failure, unspecified: Principal | ICD-10-CM

## 2021-12-24 DIAGNOSIS — Z8249 Family history of ischemic heart disease and other diseases of the circulatory system: Secondary | ICD-10-CM

## 2021-12-24 DIAGNOSIS — F17210 Nicotine dependence, cigarettes, uncomplicated: Principal | ICD-10-CM | POA: Diagnosis present

## 2021-12-24 LAB — CBC
HCT: 43.4 % (ref 36.0–46.0)
HCT: 42.6 % (ref 36.0–46.0)
Hemoglobin: 14 g/dL (ref 12.0–15.0)
Hemoglobin: 13.5 g/dL (ref 12.0–15.0)
MCH: 28.9 pg (ref 26.0–34.0)
MCH: 29.3 pg (ref 26.0–34.0)
MCHC: 32.3 g/dL (ref 30.0–36.0)
MCHC: 31.7 g/dL (ref 30.0–36.0)
MCV: 89.7 fL (ref 80.0–100.0)
MCV: 92.4 fL (ref 80.0–100.0)
Platelets: 364 10*3/uL (ref 150–400)
Platelets: 307 10*3/uL (ref 150–400)
RBC: 4.84 MIL/uL (ref 3.87–5.11)
RBC: 4.61 MIL/uL (ref 3.87–5.11)
RDW: 13.3 % (ref 11.5–15.5)
RDW: 13.4 % (ref 11.5–15.5)
WBC: 6.6 10*3/uL (ref 4.0–10.5)
WBC: 8.2 10*3/uL (ref 4.0–10.5)
nRBC: 0 % (ref 0.0–0.2)
nRBC: 0 % (ref 0.0–0.2)

## 2021-12-24 LAB — BASIC METABOLIC PANEL
Anion gap: 8 (ref 5–15)
BUN: 19 mg/dL (ref 6–20)
CO2: 25 mmol/L (ref 22–32)
Calcium: 9.2 mg/dL (ref 8.9–10.3)
Chloride: 107 mmol/L (ref 98–111)
Creatinine, Ser: 1 mg/dL (ref 0.44–1.00)
GFR, Estimated: >60 mL/min (ref >60)
Glucose, Bld: 105 mg/dL — ABNORMAL HIGH (ref 70–99)
Potassium: 4.9 mmol/L (ref 3.5–5.1)
Sodium: 140 mmol/L (ref 135–145)

## 2021-12-24 LAB — RESP PANEL BY RT-PCR (FLU A&B, COVID) ARPGX2
Influenza A by PCR: NEGATIVE
Influenza B by PCR: NEGATIVE
SARS Coronavirus 2 by RT PCR: NEGATIVE

## 2021-12-24 LAB — BRAIN NATRIURETIC PEPTIDE: B Natriuretic Peptide: 394.4 pg/mL — ABNORMAL HIGH (ref 0.0–100.0)

## 2021-12-24 LAB — HCG, SERUM, QUALITATIVE: Preg, Serum: NEGATIVE

## 2021-12-24 LAB — CREATININE, SERUM
Creatinine, Ser: 1.07 mg/dL — ABNORMAL HIGH (ref 0.44–1.00)
GFR, Estimated: >60 mL/min (ref >60)

## 2021-12-24 LAB — TROPONIN I (HIGH SENSITIVITY)
Troponin I (High Sensitivity): 13 ng/L (ref <18)
Troponin I (High Sensitivity): 14 ng/L (ref <18)

## 2021-12-24 MED ORDER — ALBUTEROL SULFATE (2.5 MG/3ML) 0.083% IN NEBU
2.5000 mg | INHALATION_SOLUTION | RESPIRATORY_TRACT | Status: DC | PRN
  Administered 2021-12-25: 2.5 mg via RESPIRATORY_TRACT
  Filled 2021-12-24: qty 3

## 2021-12-24 MED ORDER — ENOXAPARIN SODIUM 40 MG/0.4ML IJ SOSY: 40.0000 mg | PREFILLED_SYRINGE | INTRAMUSCULAR | Status: DC

## 2021-12-24 MED ORDER — ENOXAPARIN SODIUM 100 MG/ML IJ SOSY
100.0000 mg | PREFILLED_SYRINGE | INTRAMUSCULAR | Status: DC
  Administered 2021-12-24 – 2021-12-26 (×3): 100 mg via SUBCUTANEOUS
  Filled 2021-12-24 (×3): qty 1

## 2021-12-24 MED ORDER — MELATONIN 3 MG PO TABS
3.0000 mg | ORAL_TABLET | Freq: Once | ORAL | Status: AC
  Administered 2021-12-24: 3 mg via ORAL
  Filled 2021-12-24: qty 1

## 2021-12-24 MED ORDER — IOHEXOL 350 MG/ML SOLN
80.0000 mL | Freq: Once | INTRAVENOUS | Status: AC | PRN
  Administered 2021-12-24: 80 mL via INTRAVENOUS

## 2021-12-24 MED ORDER — ACETAMINOPHEN 650 MG RE SUPP: 650.0000 mg | Freq: Four times a day (QID) | RECTAL | Status: DC | PRN

## 2021-12-24 MED ORDER — FUROSEMIDE 10 MG/ML IJ SOLN
20.0000 mg | Freq: Once | INTRAMUSCULAR | Status: AC
  Administered 2021-12-24: 20 mg via INTRAVENOUS
  Filled 2021-12-24: qty 2

## 2021-12-24 MED ORDER — ACETAMINOPHEN 325 MG PO TABS: 650.0000 mg | ORAL_TABLET | Freq: Four times a day (QID) | ORAL | Status: DC | PRN

## 2021-12-24 MED ORDER — CARVEDILOL 6.25 MG PO TABS
18.7500 mg | ORAL_TABLET | Freq: Two times a day (BID) | ORAL | Status: DC
  Administered 2021-12-24 – 2021-12-27 (×6): 18.75 mg via ORAL
  Filled 2021-12-24 (×7): qty 1

## 2021-12-24 MED ORDER — FUROSEMIDE 10 MG/ML IJ SOLN
40.0000 mg | Freq: Two times a day (BID) | INTRAMUSCULAR | Status: DC
  Administered 2021-12-24 – 2021-12-27 (×6): 40 mg via INTRAVENOUS
  Filled 2021-12-24 (×6): qty 4

## 2021-12-24 MED ORDER — LOSARTAN POTASSIUM 25 MG PO TABS
25.0000 mg | ORAL_TABLET | Freq: Every day | ORAL | Status: DC
  Administered 2021-12-25 – 2021-12-27 (×3): 25 mg via ORAL
  Filled 2021-12-24 (×3): qty 1

## 2021-12-24 NOTE — ED Notes (Signed)
Pt asking for morning meds. Informed Zenia Resides - RN.

## 2021-12-24 NOTE — Progress Notes (Signed)
Plan of Care Note for accepted transfer   Patient: Rebecca Tapia MRN: [id number]   DOA: 12/24/2021  The case was discussed with Dr. Davina Poke from the cardiology heart failure team.  Given the patient's BMI of 58.3 kg/m and weight of 195 kg there is not much of the advanced failure can offer her at this time.  He has asked me to change the bed request back to [facility].  Rebecca Milan, MD 12/24/2021  Check [email or web address] for on-call coverage.  Nursing staff, Please call [place] number on Amion as soon as patient's arrival, so appropriate admitting provider can evaluate the pt.

## 2021-12-24 NOTE — Progress Notes (Signed)
Plan of Care Note for accepted transfer   Patient: Rebecca Tapia MRN: [id number]   DOA: 12/24/2021  Facility requesting transfer: [facility]. Requesting Provider: Regan Lemming, ND. Reason for transfer: Dyspnea in the setting of CHF laceration. Facility course:  26 year old female with a past medical history of class III obesity with a BMI of 58.3 kg/m and cannabis abuse who was recently admitted/discharged from [facility] due to multifocal pneumonia and acute systolic heart failure.  She returned to the [facility] emergency department today with dyspnea.  Imaging and chemistry consistent with acute on chronic systolic CHF exacerbation.  She has now signs of right heart failure on CTA, but no obvious PE.  A room request has been made for [facility] since this is the patient's preference, but we have contacted cardiology on-call for chart review to see if the patient needs to go to [facility] for right-sided cath or any other cardiovascular procedures instead.  Plan of care: The patient is accepted for admission to Telemetry unit, at [facility]..   Author: Reubin Milan, MD 12/24/2021  Check [email or web address] for on-call coverage.  Nursing staff, Please call [place] number on Amion as soon as patient's arrival, so appropriate admitting provider can evaluate the pt.

## 2021-12-24 NOTE — Progress Notes (Signed)
Pt arrived  from [facility]. She is alert & oriented x 3. Aunt is visiting in room with pt. Discussed need to change positions very slowly with lasix, when giving first dose. Pt was stressed over needing to call for assistance, prior to getting into restroom. Documenting RN explained associated high risks for falls, given she is experiencing dyspnea, potential for dizziness, hypotension, and has urinary frequency with lasix. Pt refused to call for assistance prior to getting OOB to urinate, or have bed alarm on bed activated. Aunt visiting heard pt clearly declare bed alarm refusal.

## 2021-12-24 NOTE — ED Notes (Signed)
Patient ambulatory to restroom without O2.  Desat to 88%  Placed back on 02 at 2L via [place]

## 2021-12-24 NOTE — ED Triage Notes (Signed)
Pt presents for CP and SOB. Recovering from recent PNU.  Endorses lower limb swelling.  Pt sounds breathless during interview, tachypneic, coughs up pink phlegm during exam.   H/o CHF  Noted to by 88% on RA, placed on 2L [place] and improved to 96%

## 2021-12-24 NOTE — H&P (Signed)
History and Physical    Patient: Rebecca Tapia ZOX:[id number] DOB: 03-20-1995 DOA: 12/24/2021 DOS: the patient was seen and examined on 12/24/2021 PCP: Patient, No Pcp Per  Patient coming from: Home  Chief Complaint:  Chief Complaint  Patient presents with   Chest Pain   HPI: Rebecca Tapia is a 26 y.o. female with medical history significant of morbid obesity who was recently discharged for PNA presents with worsening sob and generalized edema. Pt recalls being in usual state of health until afternoon of 11/6 when pt was around people who were smoking. Pt soon experienced sob and initially attributed it to second hand smoke. Several hrs later, pt recalled worse sob and developing LE edema. Pt tried to elevate LE without improvement. Symptoms progressively worsened, prompting ED visit. In ED, pt found to have elevated BNP just shy of 400. CXR with patchy diffuse infiltrates. Pt was given dose of IV lasix with some improvement. Pt was accepted in transfer to [facility] for further work up. Cardiology team was called by EDP.   Review of Systems: As mentioned in the history of present illness. All other systems reviewed and are negative. Past Medical History:  Diagnosis Date   Morbid obesity [facility])    Past Surgical History:  Procedure Laterality Date   WISDOM TOOTH EXTRACTION     Social History:  reports that she has been smoking cigarettes. She has a 5.00 pack-year smoking history. She has never used smokeless tobacco. She reports current alcohol use. She reports current drug use. Drug: Marijuana.  Allergies  Allergen Reactions   Bee Venom Anaphylaxis    Family History  Problem Relation Age of Onset   Hypertension Father    Heart disease Father     Prior to Admission medications   Medication Sig Start Date End Date Taking? Authorizing Provider  albuterol (PROVENTIL) (2.5 MG/3ML) 0.083% nebulizer solution Inhale 1 vial via nebulizer every 4 hours as needed for wheezing or shortness of  breath. 12/17/21  Yes Mariel Aloe, MD  carvedilol (COREG) 6.25 MG tablet Take 3 tablets by mouth 2 times daily with a meal. 12/17/21 03/17/22 Yes Mariel Aloe, MD  levonorgestrel (MIRENA) 20 MCG/24HR IUD 1 each by Intrauterine route once.   Yes [provider]    Physical Exam: Vitals:   12/24/21 1200 12/24/21 1230 12/24/21 1528 12/24/21 1630  BP: 115/74 (!) 124/94  (!) 140/107  Pulse: 95 97  (!) 110  Resp:    (!) 35  Temp:   98.1 F (36.7 C) (!) 97.4 F (36.3 C)  TempSrc:   Oral Oral  SpO2: 97% 94%  97%  Weight:       General exam: Awake, laying in bed, in nad Respiratory system: Increased respiratory effort, decreased BS Cardiovascular system: regular rate, s1, s2 Gastrointestinal system: Soft, nondistended, positive BS Central nervous system: CN2-12 grossly intact, strength intact Extremities: Perfused, no clubbing, BLE edema Skin: Normal skin turgor, no notable skin lesions seen Psychiatry: Mood normal // no visual hallucinations   Data Reviewed:  Labs reviewed: Na 140, K 4.9, Cr 1.00, WBC 6.6  Assessment and Plan: No notes have been filed under this hospital service. Service: Hospitalist Acute on chronic systolic CHF -Presents with elevated BNP of 394 -Grossly vol overloaded on exam -Would continue scheduled IV lasix -Cont home coreg -Chart reviewed. Pt was not discharged on ARB at last admit secondary to mild AKI -Renal function currently stable, thus would start losartan -Recheck bmet in AM -Cardiology consulted prior  to arrival to [facility] -follow daily wt and I/o's  Morbid obesity -recommend diet/lifestyle modification   Advance Care Planning:   Code Status: Prior Full  Consults: Cardiology  Family Communication: Pt in room  Severity of Illness: The appropriate patient status for this patient is OBSERVATION. Observation status is judged to be reasonable and necessary in order to provide the required intensity of service to ensure the patient's  safety. The patient's presenting symptoms, physical exam findings, and initial radiographic and laboratory data in the context of their medical condition is felt to place them at decreased risk for further clinical deterioration. Furthermore, it is anticipated that the patient will be medically stable for discharge from the hospital within 2 midnights of admission.   Author: Rickey Barbara, MD 12/24/2021 5:42 PM  For on call review [email or web address].

## 2021-12-24 NOTE — Progress Notes (Signed)
Plan of Care Note for accepted transfer   Patient: Rebecca Tapia MRN: [id number]   DOA: 12/24/2021  After discussion with the advanced heart failure team, transfer to [facility] will be deferred.  They have requested to change it to [facility] for further evaluation and treatment.  Author: Reubin Milan, MD 12/24/2021  Check [email or web address] for on-call coverage.  Nursing staff, Please call [place] number on Amion as soon as patient's arrival, so appropriate admitting provider can evaluate the pt.

## 2021-12-24 NOTE — ED Provider Notes (Signed)
[facility] EMERGENCY DEPT Provider Note   CSN: [id number] Arrival date & time: 12/24/21  6073     History  Chief Complaint  Patient presents with   Chest Pain    Rebecca Tapia is a 26 y.o. female.   Chest Pain    26 year old female with medical history significant for morbid obesity, CHF last EF 45% thought to be due to viral induced nonischemic cardiomyopathy who presents to the emergency department with chest pain and shortness of breath.  The patient states that she has recently recovering from discharge after hospital admission with a bilateral pneumonia.  She was diagnosed with CHF.  She endorses bilateral lower limb swelling and over the past 12 hours has endorsed significant dyspnea.  She has difficulty lying flat.  She feels that she cannot catch her breath.  She is mildly tachypneic and has been coughing up pink phlegm today.  She endorses sharp pleuritic chest discomfort.  Home Medications Prior to Admission medications   Medication Sig Start Date End Date Taking? Authorizing Provider  albuterol (PROVENTIL) (2.5 MG/3ML) 0.083% nebulizer solution Inhale 1 vial via nebulizer every 4 hours as needed for wheezing or shortness of breath. 12/17/21  Yes Mariel Aloe, MD  carvedilol (COREG) 6.25 MG tablet Take 3 tablets by mouth 2 times daily with a meal. 12/17/21 03/17/22 Yes Mariel Aloe, MD  levonorgestrel (MIRENA) 20 MCG/24HR IUD 1 each by Intrauterine route once.   Yes [provider]      Allergies    Bee venom    Review of Systems   Review of Systems  Cardiovascular:  Positive for chest pain.    Physical Exam Updated Vital Signs BP (!) 140/107 (BP Location: Right Arm)   Pulse (!) 110   Temp (!) 97.4 F (36.3 C) (Oral)   Resp (!) 35   Wt (!) 195 kg   SpO2 97%   BMI 58.30 kg/m  Physical Exam Vitals and nursing note reviewed.  Constitutional:      General: She is not in acute distress.    Appearance: She is well-developed.  She is obese.     Comments: Morbidly obese  HENT:     Head: Normocephalic and atraumatic.  Eyes:     Conjunctiva/sclera: Conjunctivae normal.  Cardiovascular:     Rate and Rhythm: Regular rhythm. Tachycardia present.     Heart sounds: No murmur heard. Pulmonary:     Effort: Pulmonary effort is normal. Tachypnea present. No respiratory distress.     Breath sounds: Examination of the right-lower field reveals rales. Examination of the left-lower field reveals rales. Rales present.  Abdominal:     Palpations: Abdomen is soft.     Tenderness: There is no abdominal tenderness.  Musculoskeletal:        General: No swelling.     Cervical back: Neck supple.     Right lower leg: Edema present.     Left lower leg: Edema present.     Comments: 1+ pitting edema bilaterally  Skin:    General: Skin is warm and dry.     Capillary Refill: Capillary refill takes less than 2 seconds.  Neurological:     Mental Status: She is alert.  Psychiatric:        Mood and Affect: Mood normal.     ED Results / Procedures / Treatments   Labs (all labs ordered are listed, but only abnormal results are displayed) Labs Reviewed  BASIC METABOLIC PANEL - Abnormal; Notable for the  following components:      Result Value   Glucose, Bld 105 (*)    All other components within normal limits  BRAIN NATRIURETIC PEPTIDE - Abnormal; Notable for the following components:   B Natriuretic Peptide 394.4 (*)    All other components within normal limits  RESP PANEL BY RT-PCR (FLU A&B, COVID) ARPGX2  CBC  HCG, SERUM, QUALITATIVE  TROPONIN I (HIGH SENSITIVITY)  TROPONIN I (HIGH SENSITIVITY)    EKG None  Radiology CT Angio Chest PE W and/or Wo Contrast  Result Date: 12/24/2021 CLINICAL DATA:  Pulmonary embolism (PE) suspected, high prob. Chest pain and dyspnea. Tachypnea. Cough. History of CHF. EXAM: CT ANGIOGRAPHY CHEST WITH CONTRAST TECHNIQUE: Multidetector CT imaging of the chest was performed using the standard  protocol during bolus administration of intravenous contrast. Multiplanar CT image reconstructions and MIPs were obtained to evaluate the vascular anatomy. RADIATION DOSE REDUCTION: This exam was performed according to the departmental dose-optimization program which includes automated exposure control, adjustment of the mA and/or kV according to patient size and/or use of iterative reconstruction technique. CONTRAST:  71mL OMNIPAQUE IOHEXOL 350 MG/ML SOLN COMPARISON:  Chest radiograph from earlier today. 12/17/2021 chest CT angiogram. FINDINGS: Cardiovascular: The study is low quality for the evaluation of pulmonary embolism, degraded by body habitus and motion. There are no convincing filling defects in the central, lobar, segmental or subsegmental pulmonary artery branches to suggest acute pulmonary embolism. Normal course and caliber of the thoracic aorta. Dilated main pulmonary artery (3.9 cm diameter). Mild-to-moderate cardiomegaly. No significant pericardial fluid/thickening. Mediastinum/Nodes: No significant thyroid nodules. Unremarkable esophagus. No axillary adenopathy. Mildly enlarged right paratracheal lymph nodes, largest 1.4 cm (series 4/image 42), unchanged. No new pathologically enlarged mediastinal nodes. No pathologically enlarged hilar nodes. Lungs/Pleura: No pneumothorax. Small right and trace left dependent pleural effusions. Extensive patchy bilateral lung consolidation throughout all lung lobes, most prominent in the upper lobes, predominantly in a perihilar distribution, worsened from prior chest CT. No discrete pulmonary nodules. Upper abdomen: Contrast reflux into the IVC and hepatic veins. Musculoskeletal:  No aggressive appearing focal osseous lesions. Review of the MIP images confirms the above findings. IMPRESSION: 1. Limited scan.  No evidence of acute pulmonary embolism. 2. Mild-to-moderate cardiomegaly. Contrast reflux into the IVC and hepatic veins, suggesting right heart  dysfunction. 3. Extensive patchy bilateral lung consolidation throughout all lung lobes, most prominent in the upper lobes, predominantly in a perihilar distribution, worsened from prior chest CT, most compatible with severe cardiogenic pulmonary edema. 4. Small right and trace left dependent pleural effusions. 5. Dilated main pulmonary artery, suggesting pulmonary arterial hypertension. 6. Stable mild mediastinal lymphadenopathy, nonspecific, probably reactive. Electronically Signed   By: Ilona Sorrel M.D.   On: 12/24/2021 08:58   DG Chest 2 View  Result Date: 12/24/2021 CLINICAL DATA:  Shortness of breath.  CHF. EXAM: CHEST - 2 VIEW COMPARISON:  12/14/2021 FINDINGS: The cardio pericardial silhouette is enlarged. Diffuse interstitial and bilateral airspace disease is compatible with pneumonia although diffuse infection is not excluded. No substantial pleural effusion. The visualized bony structures of the thorax are unremarkable. IMPRESSION: Diffuse interstitial and bilateral airspace disease compatible with pneumonia although diffuse infection is not excluded. Electronically Signed   By: Misty Stanley M.D.   On: 12/24/2021 06:58    Procedures Procedures    Medications Ordered in ED Medications  carvedilol (COREG) tablet 18.75 mg (18.75 mg Oral Given 12/24/21 1529)  iohexol (OMNIPAQUE) 350 MG/ML injection 80 mL (80 mLs Intravenous Contrast Given 12/24/21 0826)  furosemide (LASIX) injection 20 mg (20 mg Intravenous Given 12/24/21 [id number])    ED Course/ Medical Decision Making/ A&P                           Medical Decision Making Amount and/or Complexity of Data Reviewed Labs: ordered. Radiology: ordered.  Risk Prescription drug management. Decision regarding hospitalization.   26 year old female with medical history significant for morbid obesity, CHF last EF 45% thought to be due to viral induced nonischemic cardiomyopathy who presents to the emergency department with chest pain and  shortness of breath.  The patient states that she has recently recovering from discharge after hospital admission with a bilateral pneumonia.  She was diagnosed with CHF.  She endorses bilateral lower limb swelling and over the past 12 hours has endorsed significant dyspnea.  She has difficulty lying flat.  She feels that she cannot catch her breath.  She is mildly tachypneic and has been coughing up pink phlegm today.  She endorses sharp pleuritic chest discomfort.  On arrival, the patient was afebrile, tachycardic P106, RR 18, tachypneic on my exam in the 20s, BP 171/88, saturating 88% on room air, subsequent placed on 2 L O2 via nasal cannula with improvement.  Sinus tachycardia noted on cardiac telemetry.  Differential diagnosis includes PE, CHF exacerbation with pulmonary edema, pneumonia, viral infection, COVID-19, influenza, ACS, pneumothorax, pericarditis/myocarditis.  The patient's EKG read sinus tachycardia, ventricular rate 105, QRS 84, QTc 489, slower rate compared to prior, no ischemic changes noted.  Chest x-ray was performed which revealed diffuse interstitial bilateral airspace disease compatible with pneumonia although diffuse infection is not excluded.  CTA PE imaging was performed and results are as follows: IMPRESSION:  1. Limited scan.  No evidence of acute pulmonary embolism.  2. Mild-to-moderate cardiomegaly. Contrast reflux into the IVC and  hepatic veins, suggesting right heart dysfunction.  3. Extensive patchy bilateral lung consolidation throughout all lung  lobes, most prominent in the upper lobes, predominantly in a  perihilar distribution, worsened from prior chest CT, most  compatible with severe cardiogenic pulmonary edema.  4. Small right and trace left dependent pleural effusions.  5. Dilated main pulmonary artery, suggesting pulmonary arterial  hypertension.  6. Stable mild mediastinal lymphadenopathy, nonspecific, probably  reactive.    Concern for CHF  exacerbation.  The patient was administered 20 mg of IV Lasix that she is Lasix nave.  Her home medications reordered.  Laboratory evaluation significant for troponins x2 negative, COVID-19 influenza PCR testing negative, CBC without leukocytosis or anemia, hCG negative, BNP nonspecifically elevated to 394, BMP normal.  I discussed the patient's case with on-call hospitalist medicine, Dr. Olevia Bowens who accepted the patient in admission.  Plan for cardiology consultation inpatient.  Plan for inpatient admission to [facility] which is the patient's preference, this was coordinated with cardiology.   Final Clinical Impression(s) / ED Diagnoses Final diagnoses:  Acute on chronic congestive heart failure, unspecified heart failure type ([place])  Acute respiratory failure with hypoxia [facility])    Rx / DC Orders ED Discharge Orders     None         Regan Lemming, MD 12/24/21 1722

## 2021-12-25 ENCOUNTER — Encounter (HOSPITAL_COMMUNITY): Payer: Self-pay

## 2021-12-25 LAB — COMPREHENSIVE METABOLIC PANEL
ALT: 56 U/L — ABNORMAL HIGH (ref 0–44)
AST: 24 U/L (ref 15–41)
Albumin: 3.5 g/dL (ref 3.5–5.0)
Alkaline Phosphatase: 50 U/L (ref 38–126)
Anion gap: 9 (ref 5–15)
BUN: 15 mg/dL (ref 6–20)
CO2: 27 mmol/L (ref 22–32)
Calcium: 8.9 mg/dL (ref 8.9–10.3)
Chloride: 106 mmol/L (ref 98–111)
Creatinine, Ser: 0.95 mg/dL (ref 0.44–1.00)
GFR, Estimated: >60 mL/min (ref >60)
Glucose, Bld: 107 mg/dL — ABNORMAL HIGH (ref 70–99)
Potassium: 3.5 mmol/L (ref 3.5–5.1)
Sodium: 142 mmol/L (ref 135–145)
Total Bilirubin: 1.3 mg/dL — ABNORMAL HIGH (ref 0.3–1.2)
Total Protein: 7.1 g/dL (ref 6.5–8.1)

## 2021-12-25 LAB — CBC
HCT: 43.6 % (ref 36.0–46.0)
Hemoglobin: 14.1 g/dL (ref 12.0–15.0)
MCH: 29.3 pg (ref 26.0–34.0)
MCHC: 32.3 g/dL (ref 30.0–36.0)
MCV: 90.6 fL (ref 80.0–100.0)
Platelets: 311 10*3/uL (ref 150–400)
RBC: 4.81 MIL/uL (ref 3.87–5.11)
RDW: 13.2 % (ref 11.5–15.5)
WBC: 7.5 10*3/uL (ref 4.0–10.5)
nRBC: 0 % (ref 0.0–0.2)

## 2021-12-25 MED ORDER — SPIRONOLACTONE 12.5 MG HALF TABLET
12.5000 mg | ORAL_TABLET | Freq: Every day | ORAL | Status: DC
  Administered 2021-12-25 – 2021-12-27 (×3): 12.5 mg via ORAL
  Filled 2021-12-25 (×4): qty 1

## 2021-12-25 MED ORDER — IPRATROPIUM-ALBUTEROL 0.5-2.5 (3) MG/3ML IN SOLN
3.0000 mL | Freq: Four times a day (QID) | RESPIRATORY_TRACT | Status: DC
  Administered 2021-12-25 – 2021-12-27 (×9): 3 mL via RESPIRATORY_TRACT
  Filled 2021-12-25 (×10): qty 3

## 2021-12-25 MED ORDER — DM-GUAIFENESIN ER 30-600 MG PO TB12
1.0000 | ORAL_TABLET | Freq: Two times a day (BID) | ORAL | Status: DC
  Administered 2021-12-25 – 2021-12-27 (×5): 1 via ORAL
  Filled 2021-12-25 (×6): qty 1

## 2021-12-25 NOTE — Progress Notes (Signed)
Consultation Progress Note   Patient: Rebecca Tapia FUX:[id number] DOB: 1995/10/18 DOA: 12/24/2021 DOS: the patient was seen and examined on 12/25/2021 Primary service: Ivelise Castillo, Elgie Collard, MD  Brief hospital course: 26 year old female who presented to the emergency department on account of shortness of breath and a cough.  Patient was recently discharged on 10/31 where she was treated for pneumonia.  During that admission her echo was concerning for dilated cardiomyopathy with an EF of 35 to 40%.  BNP was 257. she was placed on a beta-blocker follow-up with cardiology outpatient.  During this admission patient's chest x-ray showed bilateral airspace disease compatible with pneumonia, CTA of the chest was negative for PE however showed cardiomegaly with contrast reflux into the IVC and hepatic veins suggestive of right heart dysfunction.  This was concerning for severe cardiogenic pulmonary edema.  Currently being diuresed with good response.  Assessment and Plan: Acute systolic heart failure -Newly diagnosed dilated cardiomyopathy I reviewed her EF from 02/2021 which shows EF of 35 to 40%, x-ray concerning for pulmonary vascular congestion with an elevated BNP of approx 400.  Ideology has seen the patient.  She is now on Lasix 60 mg twice daily, losartan 25 mg daily, Coreg 18.75 mg BID Spirino lactone 12.5 mg daily. -Monitor I's and O's -Daily weights -Low-sodium diet -Fluid restriction  Acute respiratory failure with hypoxia-2 L of nasal cannula secondary to volume overload.  Wean down as tolerated.  Morbid obesity Estimated body mass index is 58.59 kg/m as calculated from the following:   Height as of this encounter: 6' (1.829 m).   Weight as of this encounter: 196 kg.  Lifestyle and Dietary modifications discussed.        TR[facility] will continue to follow the patient.  Subjective: At bedside this morning, reports improvement with her breathing.  Still have a cough octave of pinkish  mucus.  Physical Exam: General exam: Morbidly obese appears calm and comfortable Respiratory system: Diminished  breath sounds respiratory effort normal. Cardiovascular system: Tachycardic S1 & S2 heard, RRR. No murmurs, rubs, gallops or clicks. Gastrointestinal system: Abdomen is nondistended, soft and nontender. Normal bowel sounds heard. Central nervous system: Alert and oriented. No focal neurological deficits. Musculoskeletal: Trace edema. No calf tenderness Skin: No cyanosis. No rashes Psychiatry: Judgement and insight appear normal. Mood & affect appropriate.    Vitals:   12/25/21 0031 12/25/21 0217 12/25/21 0438 12/25/21 1129  BP: 129/71  129/88   Pulse: (!) 126  96   Resp: 18  20   Temp: 98.7 F (37.1 C)  98 F (36.7 C)   TempSrc:   Oral   SpO2: 92% 94% (!) 89% 98%  Weight:      Height:        Data Reviewed:  There are no new results to review at this time.  Family Communication:   Time spent: 15 minutes.  Author: Baldomero Lamy, MD 12/25/2021 2:32 PM  For on call review [email or web address].

## 2021-12-25 NOTE — Progress Notes (Signed)
Heart Failure Nurse Navigator Progress Note  PCP: Patient, No Pcp Per PCP-Cardiologist: None Admission Diagnosis: None Admitted from: Home to [facility] to [facility]  Presentation:   Rebecca Tapia presented with shortness of breath, with non-productive cough, CTA shows signs of right heart failure, . BNP 394, BMI 58, Patient recently discharged from [facility] 10/28 -10/31 for PNA, new cardiomyopathy, SOB, edema. (Was started on new GDMT) In ED was Given IV Lasix.  BP 140/107, HR 110.    Navigator called and spoke with patient at [employer], education done over the phone with Heart failure sign and symptoms, daily weights, when to call her doctor or go to the ED, Diet/ fluid restrictions ( patient reported she eats out a lot, drinks lots of water about 1 gallon, drinks ETOH on the weekends and smoke marijuana 3- 4 days per weeks, Taking all medications as prescribed, attending all medical appointments. Patient voiced her understanding of education and is scheduled for a hospital HF TOC appointment on 01/08/22 @ 9 am. .     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  Other Topics Concern   Not on file  Social History Narrative   Not on file   Social Determinants of Health   Financial Resource Strain:  High Risk (12/16/2021)   Overall Financial Resource Strain (CARDIA)    Difficulty of Paying Living Expenses: Hard  Food Insecurity: No Food Insecurity (12/24/2021)   Hunger Vital Sign    Worried About Running Out of Food in the Last Year: Never true    Ran Out of Food in the Last Year: Never true  Transportation Needs: No Transportation Needs (12/24/2021)   PRAPARE - Administrator, Civil Service (Medical): No    Lack of Transportation (Non-Medical): No  Physical Activity: Not on file  Stress: Not on file  Social Connections: Not on file    High Risk Criteria for Readmission and/or Poor Patient Outcomes: Heart failure hospital admissions (last 6 months): 1  No Show rate: 11 %  Difficult social situation: No Demonstrates medication adherence: Yes Primary Language: English Literacy level: Reading, writing, and comprehension.   Barriers of Care:   Med cost: Non Ins (Pharm) Diet/ fluids ( eats out a lot/ drinks 1 gal of water/day/ETOH on weekends. Continued HF Educ Substance cessation (ETOH / THC 3-4 x/week)  Considerations/Referrals:   Referral made to Heart Failure Pharmacist Stewardship: Yes Referral made to Heart Failure CSW/NCM TOC: No Referral made to Heart & Vascular TOC clinic: Yes, rescheduled for 01/08/2022 @ 9 am.   Items for Follow-up on DC/TOC: Med costs: Non  Ins ( Pharm)  Diet/ fluids ( eats out a lot/ drinks 1 gal water per day, ETOH every weekend) Continue HF education Substance cessation ( ETOH/ THC 3-4 x/week)   Rhae Hammock, BSN, RN Heart Failure Teacher, adult education Only

## 2021-12-25 NOTE — Plan of Care (Signed)
Nutrition Education Note  RD consulted for nutrition education regarding new onset CHF.  RD provided "Heart Failure Nutrition Therapy" handout from the Academy [employer]. Reviewed patient's dietary recall. Provided examples on ways to decrease sodium intake in diet. Encouraged cooking more meals at home and discouraged the use of salt shaker. Encouraged fresh fruits and vegetables as well as whole grain sources of carbohydrates to maximize fiber intake. Reviewed label reading and products she can use for quick meals instead of take out. Had pt download nutritionix app to look up products, analyze recipes and track daily intake. Reviewed usual take out options and identified high Na foods and ingredients.  RD discussed why it is important for patient to adhere to diet recommendations, and emphasized the role of fluids, foods to avoid, and importance of weighing self daily. Teach back method used.  Expect good compliance. Pt has support of mother to assist with diet changes.  Body mass index is 58.3 kg/m. Pt meets criteria for obesity based on current BMI.  Current diet order is heart healthy. Labs and medications reviewed. No further nutrition interventions warranted at this time. RD contact information provided. If additional nutrition issues arise, please re-consult RD.   Antony Salmon, MS, RD, LDN, CNSC See AMi[facility] for contact information

## 2021-12-25 NOTE — Consult Note (Addendum)
Cardiology Consultation   Patient ID: [name] MRN: [id number]; DOB: Jul 31, 1995  Admit date: 12/24/2021 Date of Consult: 12/25/2021  PCP:  Patient, No Pcp Per   [facility] Providers Cardiologist:  Thomasene Ripple, DO   {  Patient Profile:   Rebecca Tapia is a 26 y.o. female with a history of recently diagnosed HFrEF with EF of 35-40% on Echo on 12/15/2021, morbid obesity, and marijuana use. 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 Etiology of cardiomyopathy was felt to likely be non-ischemic (possibly due to viral illnesses) and she was started on GDMT. She presented back to the ED on 12/24/2021 with worsening shortness of breath. Cardiology consulted for further evaluation of acute on chronic CHF at the request of Dr. Karene Fry (Emergency Department).  History of Present Illness:   Rebecca Tapia is a 26 year old female with the above history who is followed by Dr. Servando Salina. Patient was recently admitted from 12/14/2021 to 12/17/2021 with multifocal pneumonia. Chest CTA was negative for PE but showed multifocal pneumonia and cardiomegaly. Echo showed LVEF of 35-40% with global hypokinesis and indeterminate diastolic filling, normal RV, and no significant valvular disease. Cardiomyopathy was felt to likely be non-ischemic in nature (possibly due to viral illness). She was started on Coreg. There was initial plans for Losartan but this was not done due to mild renal insufficiency. Plan was cardiac MRI vs repeat Echo in 2-3 months.  Patient presented back to the ED on 12/24/2021 with worsening shortness of breath. EKG showed sinus tachycardia, rate 105 bpm, with no acute ischemic changes. High-sensitivity troponin negative x2. BNP 394 (increased from 257 during recent admission). Chest x-ray showed diffuse interstitial and bilateral airspace  disease compatible with pneumonia. Chest CTA was limited but there was no evidence of PE but did show mild to moderate cardiomegaly with contrast reflux into the IVC and hepatic veins suggestive of right heart dysfunction as well as extensive patchy bilateral lung consolidation throughout all lung lobes but most prominent in the upper lobes. This was felt to be most compatible with severe cardiogenic pulmonary edema. WBC 6.6, Hgb 14.0, Plts 364. Na 140, K 4.9, Glucose 105, BUN 19, Cr 1.0. Patient was noted to be grossly volume overloaded on exam and was started on IV Lasix. Cardiology consulted for further evaluation.   At the time of this evaluation, patient is resting comfortably on 2L of O2 via nasal cannula. Patient states she was initially doing well after  discharge until 12/23/2021 when she started to noticed increased lower extremity edema, atypical chest discomfort, and then progressive shortness of breath. She states she first noticed the increased lower extremity edema and then she started having some brief atypical chest discomfort that she described as a "ping" sensation or feeling that her heart was beating hard for a couple of seconds. This occurred randomly and was not associated with exertion. She denies any other episodes of chest pain/ discomfort. She then also started to have worsening shortness of breath with minimal exertion such as standing up and then ultimately at rest which is when she decided to go to the ED. She denies any other palpitations. She had a very brief episode of lightheadedness/dizziness when standing up from bed this morning but nothing significant. No syncope. She denies any fevers. She continues to have a productive cough and states she has been coughing  up pink tinged phlegm. However, the phlegm has been getting clearer with the the IV Lasix.  She notes her stools have been a little looser lately but no other GI symptoms. No abnormal bleeding in urine or stools    Past  Medical History:  Diagnosis Date   Morbid obesity (HCC)     Past Surgical History:  Procedure Laterality Date   WISDOM TOOTH EXTRACTION       Home Medications:  Prior to Admission medications   Medication Sig Start Date End Date Taking? Authorizing Provider  albuterol (PROVENTIL) (2.5 MG/3ML) 0.083% nebulizer solution Inhale 1 vial via nebulizer every 4 hours as needed for wheezing or shortness of breath. 12/17/21  Yes Narda Bonds, MD  carvedilol (COREG) 6.25 MG tablet Take 3 tablets by mouth 2 times daily with a meal. 12/17/21 03/17/22 Yes Narda Bonds, MD  levonorgestrel (MIRENA) 20 MCG/24HR IUD 1 each by Intrauterine route once.   Yes [provider]    Inpatient Medications: Scheduled Meds:  carvedilol  18.75 mg Oral BID WC   enoxaparin (LOVENOX) injection  100 mg Subcutaneous Q24H   furosemide  40 mg Intravenous BID   ipratropium-albuterol  3 mL Nebulization QID   losartan  25 mg Oral Daily   Continuous Infusions:  PRN Meds: acetaminophen **OR** acetaminophen, albuterol  Allergies:    Allergies  Allergen Reactions   Bee Venom Anaphylaxis    Social History:   Social History   Socioeconomic History   Marital status: Single    Spouse name: Not on file   Number of children: 0   Years of education: Not on file   Highest education level: High school graduate  Occupational History   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  Other Topics Concern   Not on file  Social History Narrative   Not on file   Social Determinants of Health   Financial Resource Strain: High Risk (12/16/2021)    Overall Financial Resource Strain (CARDIA)    Difficulty of Paying Living Expenses: Hard  Food Insecurity: No Food Insecurity (12/24/2021)   Hunger Vital Sign    Worried About Running Out of Food in the Last Year: Never true    Ran Out of Food in the Last Year: Never true  Transportation Needs: No Transportation Needs (12/24/2021)   PRAPARE - Administrator, Civil Service (Medical): No    Lack of Transportation (Non-Medical): No  Physical Activity: Not on file  Stress: Not on file  Social Connections: Not on file  Intimate Partner Violence: Not At Risk (12/24/2021)   Humiliation, Afraid, Rape, and Kick questionnaire    Fear of Current or Ex-Partner: No    Emotionally Abused: No    Physically Abused: No    Sexually Abused: No    Family History:   Family History  Problem Relation Age of Onset   Hypertension Father    Heart disease Father      ROS:  Please see the history of present illness.  Review of Systems  Constitutional:  Negative for fever.  HENT:  Positive for congestion. Nosebleeds: occasional nasal drip.  Respiratory:  Positive for cough, hemoptysis, sputum production and shortness of breath.   Cardiovascular:  Positive for chest pain and leg swelling.  Gastrointestinal:  Negative for blood in stool, melena, nausea and vomiting.  Genitourinary:  Negative for hematuria.  Musculoskeletal:  Negative for myalgias.  Neurological:  Positive for dizziness. Negative for loss of consciousness.  Endo/Heme/Allergies:  Does not bruise/bleed easily.  Psychiatric/Behavioral:  Positive for substance abuse (marijuana use).     All other ROS reviewed and negative.     Physical Exam/Data:   Vitals:   12/24/21 2039 12/25/21 0031 12/25/21 0217 12/25/21 0438  BP: 122/72 129/71  129/88  Pulse: 86 (!) 126  96  Resp: 19 18  20   Temp: 98 F (36.7 C) 98.7 F (37.1 C)  98 F (36.7 C)  TempSrc: Oral   Oral  SpO2: 99% 92% 94% (!) 89%  Weight:      Height:         Intake/Output Summary (Last 24 hours) at 12/25/2021 0821 Last data filed at 12/24/2021 2100 Gross per 24 hour  Intake 720 ml  Output --  Net 720 ml      12/24/2021    6:34 AM 12/14/2021    8:56 PM 09/19/2013    1:22 PM  Last 3 Weights  Weight (lbs) 429 lb 14.4 oz 432 lb 365 lb  Weight (kg) 195 kg 195.954 kg 165.563 kg     Body mass index is 58.3 kg/m.  General: 25 y.o. morbidly obese [place] female resting comfortably in no acute distress. HEENT: Normocephalic and atraumatic. Sclera clear.  Neck: Supple. JVD difficult to assess due to body habitus. Heart: RRR. Distinct S1 and S2. No murmurs, gallops, or rubs. Radial pulses 2+ and equal bilaterally. Lungs: No increased work of breathing. Clear to ausculation bilaterally. No wheezes, rhonchi, or rales.  Abdomen: Soft, non-distended, and non-tender to palpation.  Extremities: Trace lower extremity edema bilaterally. Skin: Warm and dry. Neuro: Alert and oriented x3. No focal deficits. Psych: Normal affect. Responds appropriately.   EKG:  The EKG was personally reviewed and demonstrates:  Sinus tachycardia, rate 105 bpm, with no acute ischemic changes.   Telemetry:  Telemetry was personally reviewed and demonstrates:  Normal sinus rhythm with rates in the 80s to 90s. Occasional PVCs.  Relevant CV Studies:  Echocardiogram 12/15/2021: Impressions: 1. Left ventricular ejection fraction, by estimation, is 35 to 40%. The  left ventricle has moderately decreased function. The left ventricle  demonstrates global hypokinesis. The left ventricular internal cavity size  was moderately dilated. Indeterminate   diastolic filling due to E-A fusion.   2. Right ventricular systolic function is normal. The right ventricular  size is normal.   3. Left atrial size was moderately dilated.   4. The mitral valve is normal in structure. Trivial mitral valve  regurgitation. No evidence of mitral stenosis.   5. The aortic valve is  tricuspid. Aortic valve regurgitation is not  visualized. No aortic stenosis is present.   6. The inferior vena cava is normal in size with greater than 50%  respiratory variability, suggesting right atrial pressure of 3 mmHg.    Laboratory Data:  High Sensitivity Troponin:   Recent Labs  Lab 12/14/21 2121 12/14/21 2318 12/24/21 0643 12/24/21 0924  TROPONINIHS 33* 29* 13 14     Chemistry Recent Labs  Lab 12/24/21 0643 12/24/21 1848  NA  140  --   K 4.9  --   CL 107  --   CO2 25  --   GLUCOSE 105*  --   BUN 19  --   CREATININE 1.00 1.07*  CALCIUM 9.2  --   GFRNONAA >60 >60  ANIONGAP 8  --     No results for input(s): "PROT", "ALBUMIN", "AST", "ALT", "ALKPHOS", "BILITOT" in the last 168 hours. Lipids No results for input(s): "CHOL", "TRIG", "HDL", "LABVLDL", "LDLCALC", "CHOLHDL" in the last 168 hours.  Hematology Recent Labs  Lab 12/24/21 0643 12/24/21 1848  WBC 6.6 8.2  RBC 4.84 4.61  HGB 14.0 13.5  HCT 43.4 42.6  MCV 89.7 92.4  MCH 28.9 29.3  MCHC 32.3 31.7  RDW 13.3 13.4  PLT 364 307   Thyroid No results for input(s): "TSH", "FREET4" in the last 168 hours.  BNP Recent Labs  Lab 12/24/21 0643  BNP 394.4*    DDimer No results for input(s): "DDIMER" in the last 168 hours.   Radiology/Studies:  CT Angio Chest PE W and/or Wo Contrast  Result Date: 12/24/2021 CLINICAL DATA:  Pulmonary embolism (PE) suspected, high prob. Chest pain and dyspnea. Tachypnea. Cough. History of CHF. EXAM: CT ANGIOGRAPHY CHEST WITH CONTRAST TECHNIQUE: Multidetector CT imaging of the chest was performed using the standard protocol during bolus administration of intravenous contrast. Multiplanar CT image reconstructions and MIPs were obtained to evaluate the vascular anatomy. RADIATION DOSE REDUCTION: This exam was performed according to the departmental dose-optimization program which includes automated exposure control, adjustment of the mA and/or kV according to patient size and/or  use of iterative reconstruction technique. CONTRAST:  61mL OMNIPAQUE IOHEXOL 350 MG/ML SOLN COMPARISON:  Chest radiograph from earlier today. 12/17/2021 chest CT angiogram. FINDINGS: Cardiovascular: The study is low quality for the evaluation of pulmonary embolism, degraded by body habitus and motion. There are no convincing filling defects in the central, lobar, segmental or subsegmental pulmonary artery branches to suggest acute pulmonary embolism. Normal course and caliber of the thoracic aorta. Dilated main pulmonary artery (3.9 cm diameter). Mild-to-moderate cardiomegaly. No significant pericardial fluid/thickening. Mediastinum/Nodes: No significant thyroid nodules. Unremarkable esophagus. No axillary adenopathy. Mildly enlarged right paratracheal lymph nodes, largest 1.4 cm (series 4/image 42), unchanged. No new pathologically enlarged mediastinal nodes. No pathologically enlarged hilar nodes. Lungs/Pleura: No pneumothorax. Small right and trace left dependent pleural effusions. Extensive patchy bilateral lung consolidation throughout all lung lobes, most prominent in the upper lobes, predominantly in a perihilar distribution, worsened from prior chest CT. No discrete pulmonary nodules. Upper abdomen: Contrast reflux into the IVC and hepatic veins. Musculoskeletal:  No aggressive appearing focal osseous lesions. Review of the MIP images confirms the above findings. IMPRESSION: 1. Limited scan.  No evidence of acute pulmonary embolism. 2. Mild-to-moderate cardiomegaly. Contrast reflux into the IVC and hepatic veins, suggesting right heart dysfunction. 3. Extensive patchy bilateral lung consolidation throughout all lung lobes, most prominent in the upper lobes, predominantly in a perihilar distribution, worsened from prior chest CT, most compatible with severe cardiogenic pulmonary edema. 4. Small right and trace left dependent pleural effusions. 5. Dilated main pulmonary artery, suggesting pulmonary arterial  hypertension. 6. Stable mild mediastinal lymphadenopathy, nonspecific, probably reactive. Electronically Signed   By: Delbert Phenix M.D.   On: 12/24/2021 08:58   DG Chest 2 View  Result Date: 12/24/2021 CLINICAL DATA:  Shortness of breath.  CHF. EXAM: CHEST - 2 VIEW COMPARISON:  12/14/2021 FINDINGS: The cardio pericardial silhouette is enlarged. Diffuse interstitial and bilateral airspace disease  is compatible with pneumonia although diffuse infection is not excluded. No substantial pleural effusion. The visualized bony structures of the thorax are unremarkable. IMPRESSION: Diffuse interstitial and bilateral airspace disease compatible with pneumonia although diffuse infection is not excluded. Electronically Signed   By: Kennith Center M.D.   On: 12/24/2021 06:58     Assessment and Plan:   Acute on Chronic HFrEF Recently admitted with multifocal pneumonia and found to have new cardiomyopathy. Echo on 12/15/2021 showed LVEF of 35-40% with global hypokinesis and indeterminate diastolic filling, normal RV, and no significant valvular disease. Cardiomyopathy was felt to likely be non-ischemic in nature (possibly due to viral illness). She now presents back with worsening shortness of breath and was found to be grossly volume overloaded on exam. BNP elevated at 394 (increased from 257 during recent admission). Chest CTA was most compatible with severe cardiogenic pulmonary edema and there contrast reflux into the IVC and hepatic veins suggestive of right heart dysfunction. She was started on IV Lasix but no documented urinary output so far but patient states she had 800 mL of urine output last night. - Volume status is difficult to assess due to morbid obesity. Patient states edema and breathing having improved by breathing is not back to normal yet. - Will increase IV Lasix to 60mg  twice daily. - Continue Coreg 18.75mg  twice daily. - Agree with starting Losartan 25mg  daily for now. - Will start  Spironolactone 12.5mg  daily. - Would ideally start Entresto (rather than Losartan) and SGLT2 inhibitor but patient is currently uninsured. Will reach out to Case Management to run the cost for these and see if there is any patient assistance options for her. - Did discuss that a lot of CHF medications would be contraindicated if she was pregnant. However, she assures me that she has an IUD and is not pregnant.  - Continue to monitor daily weights, strict I/Os, and renal function. - Cardiomyopathy was felt to likely be non-ischemic in nature as stated above and plan cardiac MRI vs repeat Echo in 2-3 months. Consider repeating limited Echo to reassess LV and RV function. If EF has dropped further, would consider ischemic evaluation prior to discharge. Will review with MD. Will also check TSH as it does not look like this was checked last admission.  Atypical Chest Pain Patient reports very atypical chest pain that she describes as a ping sensation or feeling that her heart is beating heart for a couple of seconds at a time. EKG shows no acute ischemic changes. High-sensitivity troponin negative x2. - Does not sound like angina. Has resolved with diuresis. However, may need ischemic evaluation at some point given reduced EF (see above).  Hypertension BP initially 171/88 on arrival but has improved. Still mildly elevated at times but mostly well controlled. - Continue medications for CHF as above.    Risk Assessment/Risk Scores:    New York Heart Association (NYHA) Functional Class NYHA Class III   For questions or updates, please contact [facility] Please consult [email or web address] for contact info under    Signed, Corrin Parker, PA-C  12/25/2021 8:21 AM

## 2021-12-25 NOTE — Plan of Care (Signed)
  Problem: Education: Goal: Ability to demonstrate management of disease process will improve Outcome: Progressing Goal: Ability to verbalize understanding of medication therapies will improve Outcome: Progressing Goal: Individualized Educational Video(s) Outcome: Progressing   Problem: Activity: Goal: Capacity to carry out activities will improve Outcome: Progressing   

## 2021-12-25 NOTE — TOC Initial Note (Signed)
Transition of Care [facility]) - Initial/Assessment Note    Patient Details  Name: Rebecca Tapia MRN: [id number] Date of Birth: [birth year]  Transition of [facility]) CM/SW Contact:    Lanier Clam, RN Phone Number: 12/25/2021, 11:07 AM  Clinical Narrative: Referral for cost of Entresto-patient has obligated co pay $3-17-medicaid insurance. No further CM needs.                  Expected Discharge Plan: Home/Self Care Barriers to Discharge: No Barriers Identified   Patient Goals and CMS Choice Patient states their goals for this hospitalization and ongoing recovery are::  (Home)   Choice offered to / list presented to : Patient  Expected Discharge Plan and Services Expected Discharge Plan: Home/Self Care   Discharge Planning Services: CM Consult   Living arrangements for the past 2 months: Single Family Home                                      Prior Living Arrangements/Services Living arrangements for the past 2 months: Single Family Home Lives with:: Relatives                   Activities of Daily Living Home Assistive Devices/Equipment: None ADL Screening (condition at time of admission) Patient's cognitive ability adequate to safely complete daily activities?: Yes Is the patient deaf or have difficulty hearing?: No Does the patient have difficulty seeing, even when wearing glasses/contacts?: No Does the patient have difficulty concentrating, remembering, or making decisions?: No Patient able to express need for assistance with ADLs?: Yes Does the patient have difficulty dressing or bathing?: No Independently performs ADLs?: Yes (appropriate for developmental age) Does the patient have difficulty walking or climbing stairs?: No Weakness of Legs: None Weakness of Arms/Hands: None  Permission Sought/Granted                  Emotional Assessment              Admission diagnosis:  Acute respiratory failure with hypoxia (HCC) [J96.01] Acute on  chronic systolic CHF (congestive heart failure) (HCC) [I50.23] Acute on chronic congestive heart failure, unspecified heart failure type (HCC) [I50.9] Patient Active Problem List   Diagnosis Date Noted   Acute on chronic systolic CHF (congestive heart failure) (HCC) 12/24/2021   HFrEF (heart failure with reduced ejection fraction) (HCC) 12/16/2021   Demand ischemia 12/16/2021   Hypokalemia 12/16/2021   Morbid obesity with BMI of 50.0-59.9, adult (HCC) 12/16/2021   Multifocal pneumonia 12/14/2021   PCP:  Patient, No Pcp Per Pharmacy:   [id number] - [place], [place] - [address] AT [facility] [address] [place] [address] Phone: [phone] Fax: [phone]  [facility] [address] [place] [address] Phone: [phone] Fax: [phone]     Social Determinants of Health (SDOH) Interventions Housing Interventions: Intervention Not Indicated  Readmission Risk Interventions     No data to display

## 2021-12-26 ENCOUNTER — Other Ambulatory Visit (HOSPITAL_COMMUNITY): Payer: Self-pay

## 2021-12-26 DIAGNOSIS — R0989 Other specified symptoms and signs involving the circulatory and respiratory systems: Secondary | ICD-10-CM

## 2021-12-26 LAB — COMPREHENSIVE METABOLIC PANEL WITH GFR
ALT: 58 U/L — ABNORMAL HIGH (ref 0–44)
AST: 28 U/L (ref 15–41)
Albumin: 3.4 g/dL — ABNORMAL LOW (ref 3.5–5.0)
Alkaline Phosphatase: 51 U/L (ref 38–126)
Anion gap: 8 (ref 5–15)
BUN: 15 mg/dL (ref 6–20)
CO2: 26 mmol/L (ref 22–32)
Calcium: 8.7 mg/dL — ABNORMAL LOW (ref 8.9–10.3)
Chloride: 104 mmol/L (ref 98–111)
Creatinine, Ser: 0.93 mg/dL (ref 0.44–1.00)
GFR, Estimated: >60 mL/min
Glucose, Bld: 113 mg/dL — ABNORMAL HIGH (ref 70–99)
Potassium: 3.7 mmol/L (ref 3.5–5.1)
Sodium: 138 mmol/L (ref 135–145)
Total Bilirubin: 1 mg/dL (ref 0.3–1.2)
Total Protein: 7 g/dL (ref 6.5–8.1)

## 2021-12-26 LAB — CBC
HCT: 41.9 % (ref 36.0–46.0)
Hemoglobin: 13.5 g/dL (ref 12.0–15.0)
MCH: 29.2 pg (ref 26.0–34.0)
MCHC: 32.2 g/dL (ref 30.0–36.0)
MCV: 90.5 fL (ref 80.0–100.0)
Platelets: 303 10*3/uL (ref 150–400)
RBC: 4.63 MIL/uL (ref 3.87–5.11)
RDW: 13.2 % (ref 11.5–15.5)
WBC: 6.1 10*3/uL (ref 4.0–10.5)
nRBC: 0 % (ref 0.0–0.2)

## 2021-12-26 NOTE — Progress Notes (Signed)
Rounding Note    Patient Name: [facility]ia Date of Encounter: 12/26/2021  [facility] Cardiologist: Thomasene Ripple, DO   Subjective   No acute overnight events. Patient is doing well this morning. She still has some shortness of breath but it is improving. No chest pain. She has had some lightheadedness/dizziness mainly with position changes but it does not last long.  Inpatient Medications    Scheduled Meds:  carvedilol  18.75 mg Oral BID WC   dextromethorphan-guaiFENesin  1 tablet Oral BID   enoxaparin (LOVENOX) injection  100 mg Subcutaneous Q24H   furosemide  40 mg Intravenous BID   ipratropium-albuterol  3 mL Nebulization QID   losartan  25 mg Oral Daily   spironolactone  12.5 mg Oral Daily   Continuous Infusions:  PRN Meds: acetaminophen **OR** acetaminophen, albuterol   Vital Signs    Vitals:   12/25/21 2031 12/26/21 0508 12/26/21 0637 12/26/21 0829  BP: 115/64 (!) 114/59  105/70  Pulse: 88 89    Resp: 20 (!) 23  20  Temp: 98 F (36.7 C) 98.1 F (36.7 C)    TempSrc: Oral Oral    SpO2: 96% 98%    Weight:   (!) 193.5 kg   Height:        Intake/Output Summary (Last 24 hours) at 12/26/2021 1012 Last data filed at 12/26/2021 0500 Gross per 24 hour  Intake 360 ml  Output 2200 ml  Net -1840 ml      12/26/2021    6:37 AM 12/24/2021    6:34 AM 12/14/2021    8:56 PM  Last 3 Weights  Weight (lbs) 426 lb 9.4 oz 429 lb 14.4 oz 432 lb  Weight (kg) 193.5 kg 195 kg 195.954 kg      Telemetry    Sinus rhythm with rates mostly in the 80s to 90s. - Personally Reviewed  ECG    No new ECG tracing today. - Personally Reviewed  Physical Exam   General: 26 y.o. morbidly obese [place] female resting comfortably in no acute distress. Neck: Supple. JVD difficult to assess due to body habitus. Heart: RRR. Distinct S1 and S2. No murmurs, gallops, or rubs. Radial pulses 2+ and equal bilaterally. Lungs: No increased work of breathing. A few faint  scattered expiratory wheezes noted but otherwise clear to auscultation. No crackles appreciated. Abdomen: Soft, non-distended, and non-tender to palpation.  Extremities: Trace lower extremity edema bilaterally. Skin: Warm and dry. Neuro: Alert and oriented x3. No focal deficits. Psych: Normal affect. Responds appropriately.  Labs    High Sensitivity Troponin:   Recent Labs  Lab 12/14/21 2121 12/14/21 2318 12/24/21 0643 12/24/21 0924  TROPONINIHS 33* 29* 13 14     Chemistry Recent Labs  Lab 12/24/21 0643 12/24/21 1848 12/25/21 1025  NA 140  --  142  K 4.9  --  3.5  CL 107  --  106  CO2 25  --  27  GLUCOSE 105*  --  107*  BUN 19  --  15  CREATININE 1.00 1.07* 0.95  CALCIUM 9.2  --  8.9  PROT  --   --  7.1  ALBUMIN  --   --  3.5  AST  --   --  24  ALT  --   --  56*  ALKPHOS  --   --  50  BILITOT  --   --  1.3*  GFRNONAA >60 >60 >60  ANIONGAP 8  --  9  Lipids No results for input(s): "CHOL", "TRIG", "HDL", "LABVLDL", "LDLCALC", "CHOLHDL" in the last 168 hours.  Hematology Recent Labs  Lab 12/24/21 1848 12/25/21 1025 12/26/21 0809  WBC 8.2 7.5 6.1  RBC 4.61 4.81 4.63  HGB 13.5 14.1 13.5  HCT 42.6 43.6 41.9  MCV 92.4 90.6 90.5  MCH 29.3 29.3 29.2  MCHC 31.7 32.3 32.2  RDW 13.4 13.2 13.2  PLT 307 311 303   Thyroid No results for input(s): "TSH", "FREET4" in the last 168 hours.  BNP Recent Labs  Lab 12/24/21 0643  BNP 394.4*    DDimer No results for input(s): "DDIMER" in the last 168 hours.   Radiology    No results found.  Cardiac Studies   Echocardiogram 12/15/2021: Impressions: 1. Left ventricular ejection fraction, by estimation, is 35 to 40%. The  left ventricle has moderately decreased function. The left ventricle  demonstrates global hypokinesis. The left ventricular internal cavity size  was moderately dilated. Indeterminate   diastolic filling due to E-A fusion.   2. Right ventricular systolic function is normal. The right ventricular   size is normal.   3. Left atrial size was moderately dilated.   4. The mitral valve is normal in structure. Trivial mitral valve  regurgitation. No evidence of mitral stenosis.   5. The aortic valve is tricuspid. Aortic valve regurgitation is not  visualized. No aortic stenosis is present.   6. The inferior vena cava is normal in size with greater than 50%  respiratory variability, suggesting right atrial pressure of 3 mmHg.     Patient Profile     26 y.o. female with a history of recently diagnosed HFrEF with EF of 35-40% on Echo on 12/15/2021, morbid obesity, and marijuana use. 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 Etiology of cardiomyopathy was felt to likely be non-ischemic (possibly due to viral illnesses) and she was started on GDMT. She presented back to the [facility] on 12/24/2021 with worsening shortness of breath. Cardiology consulted for further evaluation of acute on chronic CHF at the request of Dr. Karene Fry (Emergency Department).   Assessment & Plan    Acute on Chronic HFrEF Recently admitted with multifocal pneumonia and found to have new cardiomyopathy. Echo on 12/15/2021 showed LVEF of 35-40% with global hypokinesis and indeterminate diastolic filling, normal RV, and no significant valvular disease. Cardiomyopathy was felt to likely be non-ischemic in nature (possibly due to viral illness). She now presents back with worsening shortness of breath and was found to be grossly volume overloaded on exam. BNP elevated at 394 (increased from 257 during recent admission). Chest CTA was most compatible with severe cardiogenic pulmonary edema and there contrast reflux into the IVC and hepatic veins suggestive of right heart dysfunction. She was started on IV Lasix. Documented urinary output of 2.2 L yesterday. Weight down 3lbs from yesterday. CMET  today pending. - Volume status is difficult to assess due to morbid obesity. Breathing has improved but she is still not back to baseline. Some of this may be due to recent multifocal pneumonia. She reports some mild lightheadedness/dizziness with position changes. BP somewhat labile with systolic BP ranging from 93 to 129. However, I do not think all of BPs are accurate. She states BP has been checked at both brachial artery and radial artery. When check at the brachial artery, systolic BP typically much lower in the 90s  to low 100s. When checked at the radial artery, systolic BP in the 120s to 130s. Confirmed this with RN and she did notice this to be true this morning. Requested that we only check BP from brachial artery. - Continue IV Lasix 40mg  twice daily. - Continue Coreg 18.75mg  twice daily. May ultimately need to switch to Toprol-XL which has less of a BP affect. - Continue Losartan 25mg  daily. - Continue Spironolactone 12.5mg  daily. - Would ideally start Entresto (rather than Losartan) and SGLT2 inhibitor but patient is currently uninsured. Will reach out to Case Management to run the cost for these and see if there is any patient assistance options for her. - Did discuss that a lot of CHF medications would be contraindicated if she was pregnant. However, she assures me that she has an IUD and is not pregnant.  - Continue to monitor daily weights, strict I/Os, and renal function. - Cardiomyopathy was felt to likely be non-ischemic in nature as stated above and plan was cardiac MRI vs repeat Echo in 2-3 months. She has follow-up visit with the [facility] scheduled for 01/08/2022.   Atypical Chest Pain Patient reports very atypical chest pain that she describes as a ping sensation or feeling that her heart is beating heart for a couple of seconds at a time. EKG shows no acute ischemic changes. High-sensitivity troponin negative x2. - Does not sound like angina. Has resolved with  diuresis. No need for ischemic evaluation at this time.   Hypertension BP initially 171/88 on arrival but has improved significantly with above therapy and is now soft. BP somewhat labile with systolic BP ranging from 93 to 129. However, I do not think all of BPs are accurate. She states BP has been checked at both brachial artery and radial artery. When check at the brachial artery, systolic BP typically much lower in the 90s to low 100s. When checked at the radial artery, systolic BP in the 120s to 130s. Confirmed this with RN and she did notice this to be true this morning. - Continue medications for CHF as above.  For questions or updates, please contact [facility] Please consult [email or web address] for contact info under        Signed, , PA-C  12/26/2021, 10:12 AM

## 2021-12-26 NOTE — Progress Notes (Signed)
Consultation Progress Note   Patient: [facility]ia [id number] DOB: 1995-07-27 DOA: 12/24/2021 DOS: the patient was seen and examined on 12/26/2021 Primary service: Chaynce Schafer, Elgie Collard, MD  Brief hospital course: 26 year old female who presented to the emergency department on account of shortness of breath and a cough.  Patient was recently discharged on 10/31 where she was treated for pneumonia.  During that admission her echo was concerning for dilated cardiomyopathy with an EF of 35 to 40%.  BNP was 257. she was placed on a beta-blocker follow-up with cardiology outpatient.  During this admission patient's chest x-ray showed bilateral airspace disease compatible with pneumonia, CTA of the chest was negative for PE however showed cardiomegaly with contrast reflux into the IVC and hepatic veins suggestive of right heart dysfunction.  This was concerning for severe cardiogenic pulmonary edema.  Currently being diuresed with good response.   Asessment and Plan: Acute systolic heart failure -Newly diagnosed dilated cardiomyopathy I reviewed her EF from 02/2021 which shows EF of 35 to 40%, x-ray concerning for pulmonary vascular congestion with an elevated BNP of approx 400.  Ideology has seen the patient.  She is now on Lasix 60 mg twice daily, losartan 25 mg daily, Coreg 18.75 mg BID Spirino lactone 12.5 mg daily. -Monitor I's and O's -Daily weights -Low-sodium diet -Fluid restriction   Acute respiratory failure with hypoxia-2 L of nasal cannula secondary to volume overload.  Wean down as tolerated.   Morbid obesity Estimated body mass index is 58.59 kg/m as calculated from the following:   Height as of this encounter: 6' (1.829 m).   Weight as of this encounter: 196 kg.  Lifestyle and Dietary modifications discussed.        TRH will continue to follow the patient.  Subjective: Patient seen at bedside this morning, reports improvement with her breathing, she remains on 2 L of nasal  cannula.  And mother concerned about her low blood pressures, they state that the lowest has been 70s/50s.  Reports feeling lightheaded while going to the bathroom.  Physical Exam: General exam: Morbidly obese appears calm and comfortable Respiratory system: Diminished  breath sounds respiratory effort normal. Cardiovascular system: Tachycardic S1 & S2 heard, RRR. No murmurs, rubs, gallops or clicks. Gastrointestinal system: Abdomen is nondistended, soft and nontender. Normal bowel sounds heard. Central nervous system: Alert and oriented. No focal neurological deficits. Musculoskeletal: Trace edema. No calf tenderness Skin: No cyanosis. No rashes Psychiatry: Judgement and insight appear normal. Mood & affect appropriate. Vitals:   12/26/21 0508 12/26/21 0637 12/26/21 0829 12/26/21 1216  BP: (!) 114/59  105/70 114/71  Pulse: 89     Resp: (!) 23  20 20   Temp: 98.1 F (36.7 C)     TempSrc: Oral     SpO2: 98%     Weight:  (!) 193.5 kg    Height:        Data Reviewed:  There are no new results to review at this time.  Family Communication:   Time spent: 15 minutes.  Author: , MD 12/26/2021 12:19 PM  For on call review [email or web address].

## 2021-12-26 NOTE — TOC Transition Note (Signed)
Transition of Care [facility]) - CM/SW Discharge Note   Patient Details  Name: [facility] MRN: [id number] Date of Birth: 07-29-1995  [facility]) CM/SW Contact:  Lanier Clam, RN Phone Number: 12/26/2021, 12:10 PM   Clinical Narrative: MD aware of checking into reasonable meds less costly for [facility] program if appropriate. Patient has $3 co pay.Await outcome of meds that are appropriate for [facility] program.     Final next level of care: Home/Self Care Barriers to Discharge: No Barriers Identified   Patient Goals and CMS Choice Patient states their goals for this hospitalization and ongoing recovery are::  (Home)   Choice offered to / list presented to : Patient  Discharge Placement                       Discharge Plan and Services   Discharge Planning Services: CM Consult                                 Social Determinants of Health (SDOH) Interventions Housing Interventions: Intervention Not Indicated   Readmission Risk Interventions     No data to display

## 2021-12-27 ENCOUNTER — Encounter (HOSPITAL_COMMUNITY): Payer: Medicaid Other

## 2021-12-27 ENCOUNTER — Inpatient Hospital Stay: Payer: Self-pay | Admitting: Nurse Practitioner

## 2021-12-27 LAB — COMPREHENSIVE METABOLIC PANEL
ALT: 56 U/L — ABNORMAL HIGH (ref 0–44)
AST: 29 U/L (ref 15–41)
Albumin: 3.3 g/dL — ABNORMAL LOW (ref 3.5–5.0)
Alkaline Phosphatase: 47 U/L (ref 38–126)
Anion gap: 7 (ref 5–15)
BUN: 14 mg/dL (ref 6–20)
CO2: 27 mmol/L (ref 22–32)
Calcium: 8.7 mg/dL — ABNORMAL LOW (ref 8.9–10.3)
Chloride: 104 mmol/L (ref 98–111)
Creatinine, Ser: 1 mg/dL (ref 0.44–1.00)
GFR, Estimated: >60 mL/min (ref >60)
Glucose, Bld: 96 mg/dL (ref 70–99)
Potassium: 4.1 mmol/L (ref 3.5–5.1)
Sodium: 138 mmol/L (ref 135–145)
Total Bilirubin: 0.8 mg/dL (ref 0.3–1.2)
Total Protein: 7 g/dL (ref 6.5–8.1)

## 2021-12-27 LAB — CBC
HCT: 44.4 % (ref 36.0–46.0)
Hemoglobin: 13.8 g/dL (ref 12.0–15.0)
MCH: 28.9 pg (ref 26.0–34.0)
MCHC: 31.1 g/dL (ref 30.0–36.0)
MCV: 92.9 fL (ref 80.0–100.0)
Platelets: 313 10*3/uL (ref 150–400)
RBC: 4.78 MIL/uL (ref 3.87–5.11)
RDW: 13.2 % (ref 11.5–15.5)
WBC: 5.7 10*3/uL (ref 4.0–10.5)
nRBC: 0 % (ref 0.0–0.2)

## 2021-12-27 MED ORDER — TORSEMIDE 20 MG PO TABS: 20.0000 mg | ORAL_TABLET | Freq: Two times a day (BID) | ORAL | 0 refills | Status: DC

## 2021-12-27 MED ORDER — TORSEMIDE 20 MG PO TABS
20.0000 mg | ORAL_TABLET | Freq: Two times a day (BID) | ORAL | Status: DC
  Filled 2021-12-27: qty 1

## 2021-12-27 MED ORDER — SPIRONOLACTONE 25 MG PO TABS: 12.5000 mg | ORAL_TABLET | Freq: Every day | ORAL | 0 refills | Status: DC

## 2021-12-27 MED ORDER — CARVEDILOL 6.25 MG PO TABS: 18.7500 mg | ORAL_TABLET | Freq: Two times a day (BID) | ORAL | 0 refills | Status: DC

## 2021-12-27 MED ORDER — LOSARTAN POTASSIUM 25 MG PO TABS: 25.0000 mg | ORAL_TABLET | Freq: Every day | ORAL | 0 refills | Status: DC

## 2021-12-27 NOTE — Progress Notes (Signed)
Consultation Progress Note   Patient: [facility] QAS:[id number] DOB: August 24, 1995 DOA: 12/24/2021 DOS: the patient was seen and examined on 12/27/2021 Primary service: Steele Ledonne, Elgie Collard, MD  Brief hospital course: 26 year old female who presented to the emergency department on account of shortness of breath and a cough.  Patient was recently discharged on 10/31 where she was treated for pneumonia.  During that admission her echo was concerning for dilated cardiomyopathy with an EF of 35 to 40%.  BNP was 257. she was placed on a beta-blocker follow-up with cardiology outpatient.  During this admission patient's chest x-ray showed bilateral airspace disease compatible with pneumonia, CTA of the chest was negative for PE however showed cardiomegaly with contrast reflux into the IVC and hepatic veins suggestive of right heart dysfunction.  This was concerning for severe cardiogenic pulmonary edema.  Currently being diuresed with good response.    Assessment and Plan: -Newly diagnosed dilated cardiomyopathy I reviewed her EF from 02/2021 which shows EF of 35 to 40%, x-ray concerning for pulmonary vascular congestion with an elevated BNP of approx 400. Cardiology following  She is now on Lasix 60 mg twice daily, losartan 25 mg daily, Coreg 18.75 mg BID Spirino lactone 12.5 mg daily. -Monitor I's and O's -Daily weights -Low-sodium diet -Fluid restriction   Acute respiratory failure with hypoxia-Was on2 L of nasal cannula secondary to volume overload.  Now on room air  Morbid obesity Estimated body mass index is 58.59 kg/m as calculated from the following:   Height as of this encounter: 6' (1.829 m).   Weight as of this encounter: 196 kg.  Lifestyle and Dietary modifications discussed.      TRH will continue to follow the patient.  Subjective: Doing well, on room air. Denies dizziness or lightheadedness on ambulation.  Physical Exam: General exam: Morbidly obese appears calm and  comfortable Respiratory system: Diminished  breath sounds respiratory effort normal. Cardiovascular system: Tachycardic S1 & S2 heard, RRR. No murmurs, rubs, gallops or clicks. Gastrointestinal system: Abdomen is nondistended, soft and nontender. Normal bowel sounds heard. Central nervous system: Alert and oriented. No focal neurological deficits. Musculoskeletal: Trace edema. No calf tenderness Skin: No cyanosis. No rashes Psychiatry: Judgement and insight appear normal. Mood & affect appropriate. Vitals:   12/26/21 1338 12/26/21 2102 12/27/21 0641 12/27/21 0747  BP: 110/69 96/80 116/84   Pulse: 93 88 90 88  Resp: 18 19  20   Temp: 97.6 F (36.4 C) 97.7 F (36.5 C) 98.6 F (37 C)   TempSrc: Oral Oral Oral   SpO2: 97% 100% 100% 94%  Weight:   (!) 192.9 kg   Height:        Data Reviewed:  There are no new results to review at this time.  Family Communication:   Time spent: 15 minutes.  Author: , MD 12/27/2021 10:43 AM  For on call review [email or web address].

## 2021-12-27 NOTE — TOC Transition Note (Signed)
Transition of Care [facility]) - CM/SW Discharge Note   Patient Details  Name: [facility]ia MRN: [id number] Date of Birth: July 25, 1995  Transition [facility] Care [facility]) CM/SW Contact:  Lanier Clam, RN Phone Number: 12/27/2021, 2:17 PM   Clinical Narrative:Spoke to patient about using [facility] program for med asst-she declines-she can afford her current new scripts @ d/c.No further CM needs.       Final next level of care: Home/Self Care Barriers to Discharge: No Barriers Identified   Patient Goals and CMS Choice Patient states their goals for this hospitalization and ongoing recovery are::  (Home)   Choice offered to / list presented to : Patient  Discharge Placement                       Discharge Plan and Services   Discharge Planning Services: CM Consult                                 Social Determinants of Health (SDOH) Interventions Housing Interventions: Intervention Not Indicated   Readmission Risk Interventions     No data to display

## 2021-12-27 NOTE — Discharge Summary (Signed)
Physician Discharge Summary   Patient: Rebecca Tapia MRN: [id number] DOB: 12/19/1995  Admit date:     12/24/2021  Discharge date: 12/27/21  Discharge Physician: Elgie Collard Hobie Kohles   PCP: Patient, No Pcp Per   Recommendations at discharge:    Follow up with CHF clinic and PCP  Discharge Diagnoses: Principal Problem:   Acute on chronic systolic CHF (congestive heart failure) (HCC)  Resolved Problems:   * No resolved hospital problems. *  Hospital Course:  26 y.o. female with medical history significant of morbid obesity who was recently discharged for PNA presents with worsening sob and generalized edema. Pt recalls being in usual state of health until afternoon of 11/6 when pt was around people who were smoking. Pt soon experienced sob and initially attributed it to second hand smoke. Several hrs later, pt recalled worse sob and developing LE edema. Pt tried to elevate LE without improvement. Symptoms progressively worsened, prompting ED visit. In ED, pt found to have elevated BNP just shy of 400. CXR with patchy diffuse infiltrates. Pt was given dose of IV lasix with some improvement. Pt was accepted in transfer to [facility] for further work up   Assessment and Plan: Acute on chronic CHF-Newly diagnosed dilated cardiomyopathy. Patient presented with cough and worsening dyspnea. C x-ray concerning for pulmonary vascular congestion with an elevated BNP of approx 400.  EF from 11/2021 shows 35 to 40%, She iwas diuresed with Lasix 60 mg twice daily, losartan 25 mg daily, Coreg 18.75 mg BID Spirino lactone 12.5 mg daily. DC meds include- Carvedilol 18.75 mg BID Losartan 25 mg daily Spironolactone 12.5 mg daily Torsemide 20 mg BID  -Follow up with CHFclinic.   Acute respiratory failure with hypoxia-Was on2 L of nasal cannula secondary to volume overload.  Now on room air   Morbid obesity Estimated body mass index is 58.59 kg/m as calculated from the following:   Height as of this encounter:  6' (1.829 m).   Weight as of this encounter: 196 kg.  Lifestyle and Dietary modifications discussed.        Consultants: Cardiology Procedures performed:   Disposition: Home Diet recommendation:  Cardiac diet DISCHARGE MEDICATION: Allergies as of 12/27/2021       Reactions   Bee Venom Anaphylaxis        Medication List     TAKE these medications    albuterol (2.5 MG/3ML) 0.083% nebulizer solution Commonly known as: PROVENTIL Inhale 1 vial via nebulizer every 4 hours as needed for wheezing or shortness of breath.   carvedilol 6.25 MG tablet Commonly known as: COREG Take 3 tablets (18.75 mg total) by mouth 2 (two) times daily with a meal.   levonorgestrel 20 MCG/24HR IUD Commonly known as: MIRENA 1 each by Intrauterine route once.   losartan 25 MG tablet Commonly known as: COZAAR Take 1 tablet (25 mg total) by mouth daily. Start taking on: December 28, 2021   spironolactone 25 MG tablet Commonly known as: ALDACTONE Take 0.5 tablets (12.5 mg total) by mouth daily. Start taking on: December 28, 2021   torsemide 20 MG tablet Commonly known as: DEMADEX Take 1 tablet (20 mg total) by mouth 2 (two) times daily.        Follow-up Information     [facility]. Go in 14 day(s).   Specialty: Cardiology Why: Hospital follow up PLEASE bring a current medication list to appointment FREE valet parking, Entrance C, off [employer] information: [address] [id number] mc [place] [address] [phone]        [facility]. Schedule an appointment as soon as possible for a visit.   Contact information: [place] [id number] [phone]               Discharge Exam: Ceasar Mons Weights   12/24/21 4315 12/26/21 4008 12/27/21 0641  Weight: (!) 195 kg (!) 193.5 kg (!) 192.9 kg   General exam: Morbidly obese  appears calm and comfortable Respiratory system: Diminished  breath sounds respiratory effort normal. Cardiovascular system: Tachycardic S1 & S2 heard, RRR. No murmurs, rubs, gallops or clicks. Gastrointestinal system: Abdomen is nondistended, soft and nontender. Normal bowel sounds heard. Central nervous system: Alert and oriented. No focal neurological deficits. Musculoskeletal: Trace edema. No calf tenderness Skin: No cyanosis. No rashes Psychiatry: Judgement and insight appear normal. Mood & affect appropriate.  Condition at discharge: good  The results of significant diagnostics from this hospitalization (including imaging, microbiology, ancillary and laboratory) are listed below for reference.   Imaging Studies: CT Angio Chest PE W and/or Wo Contrast  Result Date: 12/24/2021 CLINICAL DATA:  Pulmonary embolism (PE) suspected, high prob. Chest pain and dyspnea. Tachypnea. Cough. History of CHF. EXAM: CT ANGIOGRAPHY CHEST WITH CONTRAST TECHNIQUE: Multidetector CT imaging of the chest was performed using the standard protocol during bolus administration of intravenous contrast. Multiplanar CT image reconstructions and MIPs were obtained to evaluate the vascular anatomy. RADIATION DOSE REDUCTION: This exam was performed according to the departmental dose-optimization program which includes automated exposure control, adjustment of the mA and/or kV according to patient size and/or use of iterative reconstruction technique. CONTRAST:  36mL OMNIPAQUE IOHEXOL 350 MG/ML SOLN COMPARISON:  Chest radiograph from earlier today. 12/17/2021 chest CT angiogram. FINDINGS: Cardiovascular: The study is low quality for the evaluation of pulmonary embolism, degraded by body habitus and motion. There are no convincing filling defects in the central, lobar, segmental or subsegmental pulmonary artery branches to suggest acute pulmonary embolism. Normal course and caliber of the thoracic aorta. Dilated main pulmonary  artery (3.9 cm diameter). Mild-to-moderate cardiomegaly. No significant pericardial fluid/thickening. Mediastinum/Nodes: No significant thyroid nodules. Unremarkable esophagus. No axillary adenopathy. Mildly enlarged right paratracheal lymph nodes, largest 1.4 cm (series 4/image 42), unchanged. No new pathologically enlarged mediastinal nodes. No pathologically enlarged hilar nodes. Lungs/Pleura: No pneumothorax. Small right and trace left dependent pleural effusions. Extensive patchy bilateral lung consolidation throughout all lung lobes, most prominent in the upper lobes, predominantly in a perihilar distribution, worsened from prior chest CT. No discrete pulmonary nodules. Upper abdomen: Contrast reflux into the IVC and hepatic veins. Musculoskeletal:  No aggressive appearing focal osseous lesions. Review of the MIP images confirms the above findings. IMPRESSION: 1. Limited scan.  No evidence of acute pulmonary embolism. 2. Mild-to-moderate cardiomegaly. Contrast reflux into the IVC and hepatic veins, suggesting right heart dysfunction. 3. Extensive patchy bilateral lung consolidation throughout all lung lobes, most prominent in the upper lobes, predominantly in a perihilar distribution, worsened from prior chest CT, most compatible with severe cardiogenic pulmonary edema. 4. Small right and trace left dependent pleural effusions. 5. Dilated main pulmonary artery, suggesting pulmonary arterial hypertension. 6. Stable mild mediastinal lymphadenopathy, nonspecific, probably reactive. Electronically Signed   By: Delbert Phenix M.D.   On: 12/24/2021 08:58   DG Chest 2 View  Result Date: 12/24/2021 CLINICAL DATA:  Shortness of breath.  CHF. EXAM: CHEST - 2 VIEW COMPARISON:  12/14/2021 FINDINGS: The cardio pericardial  silhouette is enlarged. Diffuse interstitial and bilateral airspace disease is compatible with pneumonia although diffuse infection is not excluded. No substantial pleural effusion. The visualized bony  structures of the thorax are unremarkable. IMPRESSION: Diffuse interstitial and bilateral airspace disease compatible with pneumonia although diffuse infection is not excluded. Electronically Signed   By: Kennith Center M.D.   On: 12/24/2021 06:58   CT Angio Chest Pulmonary Embolism (PE) W or WO Contrast  Result Date: 12/17/2021 CLINICAL DATA:  Pulmonary embolism (PE) suspected, high prob EXAM: CT ANGIOGRAPHY CHEST WITH CONTRAST TECHNIQUE: Multidetector CT imaging of the chest was performed using the standard protocol during bolus administration of intravenous contrast. Multiplanar CT image reconstructions and MIPs were obtained to evaluate the vascular anatomy. RADIATION DOSE REDUCTION: This exam was performed according to the departmental dose-optimization program which includes automated exposure control, adjustment of the mA and/or kV according to patient size and/or use of iterative reconstruction technique. CONTRAST:  OMNIPAQUE IOHEXOL 350 MG/ML SOLN COMPARISON:  CT chest 12/14/2021. FINDINGS: Cardiovascular: Evaluation of the pulmonary arteries is significantly limited due to the presence of respiratory motion artifact and body habitus. There is adequate opacification of the pulmonary trunk and proximal right and left pulmonary arteries. The lobar, segmental, and subsegmental arteries are artifactually obscured. There is no evidence of central/saddle PE. Mildly enlarged main pulmonary artery. The thoracic aorta is unremarkable. Cardiomegaly. No pericardial disease. Mediastinum/Nodes: There is mediastinal and hilar lymphadenopathy, likely reactive. The thyroid is unremarkable. Esophagus is unremarkable. Lungs/Pleura: There is multifocal airspace disease bilaterally in the upper and lower lobes, relatively sparing the right middle lobe. Distribution of airspace disease is similar in comparison to recent CT, with slight improvement in the superior segments of the lower lobes. No pleural effusion or  pneumothorax. Upper Abdomen: No acute findings. Musculoskeletal: No acute osseous abnormality. No suspicious osseous lesion. Review of the MIP images confirms the above findings. IMPRESSION: Poor evaluation of the pulmonary arteries due to respiratory motion artifact and body habitus. Within this limitation, there is no evidence of central/saddle PE. Multifocal pneumonia, with slight improvement in the superior segments of the lower lobes. Mediastinal and hilar lymphadenopathy, likely reactive. Cardiomegaly. Mildly dilated main pulmonary artery, can be seen in the setting of pulmonary hypertension. Electronically Signed   By: Caprice Renshaw M.D.   On: 12/17/2021 13:57   ECHOCARDIOGRAM COMPLETE  Result Date: 12/15/2021    ECHOCARDIOGRAM REPORT   Patient Name:   JANELLIE BENGOCHEA Date of Exam: 12/15/2021 Medical Rec #:  [id number]        Height:       72.0 in Accession #:    [id number]       Weight:       432.0 lb Date of Birth:  02/04/1996         BSA:          2.956 m Patient Age:    26 years         BP:           132/72 mmHg Patient Gender: F                HR:           115 bpm. Exam Location:  Inpatient Procedure: 2D Echo, Cardiac Doppler, Color Doppler and Intracardiac            Opacification Agent Indications:    Pulmonary Embolus I26.09  History:        Patient has no prior history of Echocardiogram examinations.  Sonographer:    Eulah Pont RDCS Referring Phys: [id number] Darlin Drop  Sonographer Comments: Patient is obese. IMPRESSIONS  1. Left ventricular ejection fraction, by estimation, is 35 to 40%. The left ventricle has moderately decreased function. The left ventricle demonstrates global hypokinesis. The left ventricular internal cavity size was moderately dilated. Indeterminate  diastolic filling due to E-A fusion.  2. Right ventricular systolic function is normal. The right ventricular size is normal.  3. Left atrial size was moderately dilated.  4. The mitral valve is normal in structure. Trivial  mitral valve regurgitation. No evidence of mitral stenosis.  5. The aortic valve is tricuspid. Aortic valve regurgitation is not visualized. No aortic stenosis is present.  6. The inferior vena cava is normal in size with greater than 50% respiratory variability, suggesting right atrial pressure of 3 mmHg. FINDINGS  Left Ventricle: Left ventricular ejection fraction, by estimation, is 35 to 40%. The left ventricle has moderately decreased function. The left ventricle demonstrates global hypokinesis. Definity contrast agent was given IV to delineate the left ventricular endocardial borders. The left ventricular internal cavity size was moderately dilated. There is no left ventricular hypertrophy. Indeterminate diastolic filling due to E-A fusion. Right Ventricle: The right ventricular size is normal. No increase in right ventricular wall thickness. Right ventricular systolic function is normal. Left Atrium: Left atrial size was moderately dilated. Right Atrium: Right atrial size was normal in size. Pericardium: There is no evidence of pericardial effusion. Mitral Valve: The mitral valve is normal in structure. Trivial mitral valve regurgitation. No evidence of mitral valve stenosis. Tricuspid Valve: The tricuspid valve is normal in structure. Tricuspid valve regurgitation is trivial. No evidence of tricuspid stenosis. Aortic Valve: The aortic valve is tricuspid. Aortic valve regurgitation is not visualized. No aortic stenosis is present. Pulmonic Valve: The pulmonic valve was normal in structure. Pulmonic valve regurgitation is mild. No evidence of pulmonic stenosis. Aorta: The aortic root is normal in size and structure. Venous: The inferior vena cava is normal in size with greater than 50% respiratory variability, suggesting right atrial pressure of 3 mmHg. IAS/Shunts: The interatrial septum was not well visualized.  LEFT VENTRICLE PLAX 2D LVIDd:         7.20 cm      Diastology LVIDs:         6.00 cm      LV e'  medial:  5.03 cm/s LV PW:         1.20 cm      LV e' lateral: 6.65 cm/s LV IVS:        1.10 cm LVOT diam:     2.40 cm LV SV:         49 LV SV Index:   17 LVOT Area:     4.52 cm  LV Volumes (MOD) LV vol d, MOD A2C: 233.0 ml LV vol d, MOD A4C: 265.0 ml LV vol s, MOD A2C: 160.0 ml LV vol s, MOD A4C: 162.0 ml LV SV MOD A2C:     73.0 ml LV SV MOD A4C:     265.0 ml LV SV MOD BP:      89.5 ml RIGHT VENTRICLE RV S prime:     16.10 cm/s TAPSE (M-mode): 2.2 cm LEFT ATRIUM              Index        RIGHT ATRIUM           Index LA diam:  5.60 cm  1.89 cm/m   RA Area:     18.10 cm LA Vol (A2C):   153.0 ml 51.77 ml/m  RA Volume:   51.10 ml  17.29 ml/m LA Vol (A4C):   140.0 ml 47.37 ml/m LA Biplane Vol: 152.0 ml 51.43 ml/m  AORTIC VALVE LVOT Vmax:   83.60 cm/s LVOT Vmean:  53.300 cm/s LVOT VTI:    0.108 m  AORTA Ao Root diam: 3.50 cm Ao Asc diam:  3.70 cm MR Peak grad: 90.6 mmHg MR Mean grad: 61.0 mmHg   SHUNTS MR Vmax:      476.00 cm/s Systemic VTI:  0.11 m MR Vmean:     369.0 cm/s  Systemic Diam: 2.40 cm Charlton Haws MD Electronically signed by Charlton Haws MD Signature Date/Time: 12/15/2021/5:24:18 PM    Final    VAS [place] LOWER EXTREMITY VENOUS (DVT)  Result Date: 12/15/2021  Lower Venous DVT Study Patient Name:  LORIENE TAUNTON  Date of Exam:   12/15/2021 Medical Rec #: [id number]         Accession #:    [id number] Date of Birth: [birth year]          Patient Gender: F Patient Age:   49 years Exam Location[facility]  [facility] Procedure:      VAS [place] LOWER EXTREMITY VENOUS (DVT) Referring Phys: Enid Derry HALL --------------------------------------------------------------------------------  Indications: Swelling.  Risk Factors: None identified. Limitations: Body habitus and poor ultrasound/tissue interface. Comparison Study: No prior studies. Performing Technologist: Chanda Busing RVT  Examination Guidelines: A complete evaluation includes B-mode imaging, spectral Doppler, color Doppler, and power Doppler as needed  of all accessible portions of each vessel. Bilateral testing is considered an integral part of a complete examination. Limited examinations for reoccurring indications may be performed as noted. The reflux portion of the exam is performed with the patient in reverse Trendelenburg.  +---------+---------------+---------+-----------+----------+--------------+ RIGHT    CompressibilityPhasicitySpontaneityPropertiesThrombus Aging +---------+---------------+---------+-----------+----------+--------------+ CFV      Full           Yes      Yes                                 +---------+---------------+---------+-----------+----------+--------------+ SFJ      Full                                                        +---------+---------------+---------+-----------+----------+--------------+ FV Prox  Full                                                        +---------+---------------+---------+-----------+----------+--------------+ FV Mid   Full                                                        +---------+---------------+---------+-----------+----------+--------------+ FV Distal               Yes      Yes                                 +---------+---------------+---------+-----------+----------+--------------+  PFV      Full                                                        +---------+---------------+---------+-----------+----------+--------------+ POP      Full           Yes      Yes                                 +---------+---------------+---------+-----------+----------+--------------+ PTV      Full                                                        +---------+---------------+---------+-----------+----------+--------------+ PERO     Full                                                        +---------+---------------+---------+-----------+----------+--------------+    +---------+---------------+---------+-----------+----------+--------------+ LEFT     CompressibilityPhasicitySpontaneityPropertiesThrombus Aging +---------+---------------+---------+-----------+----------+--------------+ CFV      Full           Yes      Yes                                 +---------+---------------+---------+-----------+----------+--------------+ SFJ      Full                                                        +---------+---------------+---------+-----------+----------+--------------+ FV Prox  Full                                                        +---------+---------------+---------+-----------+----------+--------------+ FV Mid   Full                                                        +---------+---------------+---------+-----------+----------+--------------+ FV Distal               Yes      Yes                                 +---------+---------------+---------+-----------+----------+--------------+ PFV      Full                                                        +---------+---------------+---------+-----------+----------+--------------+  POP      Full           Yes      Yes                                 +---------+---------------+---------+-----------+----------+--------------+ PTV      Full                                                        +---------+---------------+---------+-----------+----------+--------------+ PERO     Full                                                        +---------+---------------+---------+-----------+----------+--------------+     Summary: RIGHT: - There is no evidence of deep vein thrombosis in the lower extremity. However, portions of this examination were limited- see technologist comments above.  - No cystic structure found in the popliteal fossa.  LEFT: - There is no evidence of deep vein thrombosis in the lower extremity. However, portions of this examination were  limited- see technologist comments above.  - No cystic structure found in the popliteal fossa.  *See table(s) above for measurements and observations. Electronically signed by Heath Lark on 12/15/2021 at 2:47:03 PM.    Final    CT Angio Chest PE W and/or Wo Contrast  Result Date: 12/14/2021 CLINICAL DATA:  Pulmonary embolism suspected, high probability. Shortness of breath for 1 week. EXAM: CT ANGIOGRAPHY CHEST WITH CONTRAST TECHNIQUE: Multidetector CT imaging of the chest was performed using the standard protocol during bolus administration of intravenous contrast. Multiplanar CT image reconstructions and MIPs were obtained to evaluate the vascular anatomy. RADIATION DOSE REDUCTION: This exam was performed according to the departmental dose-optimization program which includes automated exposure control, adjustment of the mA and/or kV according to patient size and/or use of iterative reconstruction technique. CONTRAST:  62mL OMNIPAQUE IOHEXOL 350 MG/ML SOLN COMPARISON:  None Available. FINDINGS: Cardiovascular: The heart is enlarged and there is no pericardial effusion. The aorta is normal in caliber. The pulmonary trunk is distended which may be associated with underlying pulmonary artery hypertension. Examination of the pulmonary arteries is extremely limited due to patient's body habitus and respiratory motion artifact. There is a possible pulmonary embolus in the left lower lobe lobar, segmental and subsegmental arteries versus artifact. Evaluation of the main right pulmonary artery is limited due to artifact. Mediastinum/Nodes: There suspected prominent lymph nodes in the mediastinum measuring up to 1 cm. Prominent lymph nodes are noted at the right hilum. No axillary lymphadenopathy. The thyroid gland, trachea, and esophagus are within normal limits. Lungs/Pleura: Multifocal airspace opacities are noted in the lungs bilaterally no effusion or pneumothorax. Upper Abdomen: No acute abnormality.  Musculoskeletal: No acute osseous abnormality. Review of the MIP images confirms the above findings. IMPRESSION: 1. Examination of the pulmonary arteries is significantly limited due to respiratory motion artifact and patient's body habitus. There is a possible left lower lobe lobar, and segmental pulmonary artery filling defect versus artifact. Significant artifact is also noted in the region of the right main pulmonary artery in the possibility of underlying embolus can not  be excluded. Consider short-term repeat follow-up exam or V/Q scan. 2. Scattered airspace opacities in the lungs bilaterally, suspicious for multifocal pneumonia. 3. Distended pulmonary trunk suggesting underlying pulmonary artery hypertension. 4. Cardiomegaly. Electronically Signed   By: Thornell Sartorius M.D.   On: 12/14/2021 22:46   DG Chest 2 View  Result Date: 12/14/2021 CLINICAL DATA:  Shortness of breath, cough EXAM: CHEST - 2 VIEW COMPARISON:  Prior chest x-ray 09/19/2013 FINDINGS: Enlargement of the cardiopericardial silhouette. Diffuse interstitial prominence bilaterally most suggestive of pulmonary edema. No large effusion or pneumothorax. Overall, limited evaluation secondary to patient body habitus and poor penetration. No acute osseous abnormality. IMPRESSION: 1. Cardiomegaly and diffuse interstitial airspace opacities bilaterally. Findings are favored to reflect pulmonary edema. However, acute viral or atypical respiratory infection could appear similar. 2. Very limited chest x-ray due to patient body habitus and limited penetration. Electronically Signed   By: Malachy Moan M.D.   On: 12/14/2021 07:40    Microbiology: Results for orders placed or performed during the hospital encounter of 12/24/21  Resp Panel by RT-PCR (Flu A&B, Covid) Anterior Nasal Swab     Status: None   Collection Time: 12/24/21  7:50 AM   Specimen: Anterior Nasal Swab  Result Value Ref Range Status   SARS Coronavirus 2 by RT PCR NEGATIVE  NEGATIVE Final    Comment: (NOTE) SARS-CoV-2 target nucleic acids are NOT DETECTED.  The SARS-CoV-2 RNA is generally detectable in upper respiratory specimens during the acute phase of infection. The lowest concentration of SARS-CoV-2 viral copies this assay can detect is 138 copies/mL. A negative result does not preclude SARS-Cov-2 infection and should not be used as the sole basis for treatment or other patient management decisions. A negative result may occur with  improper specimen collection/handling, submission of specimen other than nasopharyngeal swab, presence of viral mutation(s) within the areas targeted by this assay, and inadequate number of viral copies(<138 copies/mL). A negative result must be combined with clinical observations, patient history, and epidemiological information. The expected result is Negative.  Fact Sheet for Patients:  BloggerCourse.com  Fact Sheet for Healthcare Providers:  SeriousBroker.it  This test is no t yet approved or cleared by the [place] FDA and  has been authorized for detection and/or diagnosis of SARS-CoV-2 by FDA under an Emergency Use Authorization (EUA). This EUA will remain  in effect (meaning this test can be used) for the duration of the COVID-19 declaration under Section 564(b)(1) of the Act, 21 U.S.C.section 360bbb-3(b)(1), unless the authorization is terminated  or revoked sooner.       Influenza A by PCR NEGATIVE NEGATIVE Final   Influenza B by PCR NEGATIVE NEGATIVE Final    Comment: (NOTE) The Xpert Xpress SARS-CoV-2/FLU/RSV plus assay is intended as an aid in the diagnosis of influenza from Nasopharyngeal swab specimens and should not be used as a sole basis for treatment. Nasal washings and aspirates are unacceptable for Xpert Xpress SARS-CoV-2/FLU/RSV testing.  Fact Sheet for Patients: BloggerCourse.com  Fact Sheet for Healthcare  Providers: SeriousBroker.it  This test is not yet approved or cleared by the [place] FDA and has been authorized for detection and/or diagnosis of SARS-CoV-2 by FDA under an Emergency Use Authorization (EUA). This EUA will remain in effect (meaning this test can be used) for the duration of the COVID-19 declaration under Section 564(b)(1) of the Act, 21 U.S.C. section 360bbb-3(b)(1), unless the authorization is terminated or revoked.  Performed at [facility], [address], [place], [place] [address]  Labs: CBC: Recent Labs  Lab 12/24/21 0643 12/24/21 1848 12/25/21 1025 12/26/21 0809 12/27/21 0713  WBC 6.6 8.2 7.5 6.1 5.7  HGB 14.0 13.5 14.1 13.5 13.8  HCT 43.4 42.6 43.6 41.9 44.4  MCV 89.7 92.4 90.6 90.5 92.9  PLT 364 307 311 303 313   Basic Metabolic Panel: Recent Labs  Lab 12/24/21 0643 12/24/21 1848 12/25/21 1025 12/26/21 0938 12/27/21 0713  NA 140  --  142 138 138  K 4.9  --  3.5 3.7 4.1  CL 107  --  106 104 104  CO2 25  --  GLUCOSE 105*  --  107* 113* 96  BUN 19  --  CREATININE 1.00 1.07* 0.95 0.93 1.00  CALCIUM 9.2  --  8.9 8.7* 8.7*   Liver Function Tests: Recent Labs  Lab 12/25/21 1025 12/26/21 0938 12/27/21 0713  AST ALT 56* 58* 56*  ALKPHOS 50 51 47  BILITOT 1.3* 1.0 0.8  PROT 7.1 7.0 7.0  ALBUMIN 3.5 3.4* 3.3*   CBG: No results for input(s): "GLUCAP" in the last 168 hours.  Discharge time spent: greater than 30 minutes.  Signed: Baldomero Lamy, MD [facility] 12/27/2021

## 2021-12-27 NOTE — Progress Notes (Addendum)
Rounding Note    Patient Name: Rebecca Tapia Date of Encounter: 12/27/2021  [facility] Cardiologist: Thomasene Ripple, DO   Subjective   No acute overnight events. Patient has been weaned off O2 and is doing well. She still has a little short of breath but is feeling much better and "very close" to baseline. No chest pain or palpitations. No recurrent lightheadedness/dizziness. She is very eager to go home.  Inpatient Medications    Scheduled Meds:  carvedilol  18.75 mg Oral BID WC   dextromethorphan-guaiFENesin  1 tablet Oral BID   enoxaparin (LOVENOX) injection  100 mg Subcutaneous Q24H   furosemide  40 mg Intravenous BID   ipratropium-albuterol  3 mL Nebulization QID   losartan  25 mg Oral Daily   spironolactone  12.5 mg Oral Daily   Continuous Infusions:  PRN Meds: acetaminophen **OR** acetaminophen, albuterol   Vital Signs    Vitals:   12/26/21 1338 12/26/21 2102 12/27/21 0641 12/27/21 0747  BP: 110/69 96/80 116/84   Pulse: 93 88 90 88  Resp: 18 19  20   Temp: 97.6 F (36.4 C) 97.7 F (36.5 C) 98.6 F (37 C)   TempSrc: Oral Oral Oral   SpO2: 97% 100% 100% 94%  Weight:   (!) 192.9 kg   Height:        Intake/Output Summary (Last 24 hours) at 12/27/2021 0908 Last data filed at 12/27/2021 0500 Gross per 24 hour  Intake 1080 ml  Output --  Net 1080 ml      12/27/2021    6:41 AM 12/26/2021    6:37 AM 12/24/2021    6:34 AM  Last 3 Weights  Weight (lbs) 425 lb 4.3 oz 426 lb 9.4 oz 429 lb 14.4 oz  Weight (kg) 192.9 kg 193.5 kg 195 kg      Telemetry    Normal sinus rhythm with occasional PVCs. Rates in the 80s to 90s. - Personally Reviewed  ECG    No new ECG tracing today. - Personally Reviewed  Physical Exam   General: 26 y.o. morbidly obese [place]-American female resting comfortably in no acute distress. Neck: Supple. JVD difficult to assess due to body habitus. Heart: RRR. Distinct S1 and S2. No murmurs, gallops, or rubs. Radial  pulses 2+ and equal bilaterally. Lungs: No increased work of breathing. Clear to auscultation bilaterally. Abdomen: Soft, non-distended, and non-tender to palpation.  Extremities: No to trace lower extremity edema bilaterally. Skin: Warm and dry. Neuro: Alert and oriented x3. No focal deficits. Psych: Normal affect. Responds appropriately.  Labs    High Sensitivity Troponin:   Recent Labs  Lab 12/14/21 2121 12/14/21 2318 12/24/21 0643 12/24/21 0924  TROPONINIHS 33* 29* 13 14     Chemistry Recent Labs  Lab 12/25/21 1025 12/26/21 0938 12/27/21 0713  NA 142 138 138  K 3.5 3.7 4.1  CL 106 104 104  CO2 27 26 27   GLUCOSE 107* 113* 96  BUN 15 15 14   CREATININE 0.95 0.93 1.00  CALCIUM 8.9 8.7* 8.7*  PROT 7.1 7.0 7.0  ALBUMIN 3.5 3.4* 3.3*  AST 24 28 29   ALT 56* 58* 56*  ALKPHOS 50 51 47  BILITOT 1.3* 1.0 0.8  GFRNONAA >60 >60 >60  ANIONGAP 9 8 7     Lipids No results for input(s): "CHOL", "TRIG", "HDL", "LABVLDL", "LDLCALC", "CHOLHDL" in the last 168 hours.  Hematology Recent Labs  Lab 12/25/21 1025 12/26/21 0809 12/27/21 0713  WBC 7.5 6.1 5.7  RBC 4.81  4.63 4.78  HGB 14.1 13.5 13.8  HCT 43.6 41.9 44.4  MCV 90.6 90.5 92.9  MCH 29.3 29.2 28.9  MCHC 32.3 32.2 31.1  RDW 13.2 13.2 13.2  PLT 311 303 313   Thyroid No results for input(s): "TSH", "FREET4" in the last 168 hours.  BNP Recent Labs  Lab 12/24/21 0643  BNP 394.4*    DDimer No results for input(s): "DDIMER" in the last 168 hours.   Radiology    No results found.  Cardiac Studies   Echocardiogram 12/15/2021: Impressions: 1. Left ventricular ejection fraction, by estimation, is 35 to 40%. The  left ventricle has moderately decreased function. The left ventricle  demonstrates global hypokinesis. The left ventricular internal cavity size  was moderately dilated. Indeterminate   diastolic filling due to E-A fusion.   2. Right ventricular systolic function is normal. The right ventricular  size  is normal.   3. Left atrial size was moderately dilated.   4. The mitral valve is normal in structure. Trivial mitral valve  regurgitation. No evidence of mitral stenosis.   5. The aortic valve is tricuspid. Aortic valve regurgitation is not  visualized. No aortic stenosis is present.   6. The inferior vena cava is normal in size with greater than 50%  respiratory variability, suggesting right atrial pressure of 3 mmHg.   Patient Profile     26 y.o. female with a history of recently diagnosed HFrEF with EF of 35-40% on Echo on 12/15/2021, morbid obesity, and marijuana use. 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 Etiology of cardiomyopathy was felt to likely be non-ischemic (possibly due to viral illnesses) and she was started on GDMT. She presented back to the ED on 12/24/2021 with worsening shortness of breath. Cardiology consulted for further evaluation of acute on chronic CHF at the request of Dr. Karene Fry (Emergency Department).   Assessment & Plan    Acute on Chronic HFrEF Recently admitted with multifocal pneumonia and found to have new cardiomyopathy. Echo on 12/15/2021 showed LVEF of 35-40% with global hypokinesis and indeterminate diastolic filling, normal RV, and no significant valvular disease. Cardiomyopathy was felt to likely be non-ischemic in nature (possibly due to viral illness). She now presents back with worsening shortness of breath and was found to be grossly volume overloaded on exam. BNP elevated at 394 (increased from 257 during recent admission). Chest CTA was most compatible with severe cardiogenic pulmonary edema and there contrast reflux into the IVC and hepatic veins suggestive of right heart dysfunction. She was started on IV Lasix. No documented outputs yesterday. Weight down 1 lbs form yesterday and 4 lbs from admission.   Renal function stable. - Volume status is difficult to assess due to morbid obesity. However, she feels much better. - Currently on IV Lasix 40mg  twice daily. Will discuss switch to PO with MD. - Continue Coreg 18.75mg  twice daily.  - Continue Losartan 25mg  daily. - Continue Spironolactone 12.5mg  daily. - Unfortunately unable to use Entresto or SGLT2 inhibitor right now as patient is uninsured and medications are too expensive. - Discussed importance of daily weights and sodium/fluid restrictions on discharge. We had a long discussion on the importance of low sodium diet and she did seem to be discouraged by this and stated she would not be able to eat anything that she likes. We talked about way to be more successful with  this such as meal prepping on the weekends. Will refer patient to a Nutrition as an outpatient to help with this. - Cardiomyopathy was felt to likely be non-ischemic in nature as stated above and plan was cardiac MRI vs repeat Echo in 2-3 months. She has follow-up visit with the Advanced CHF clinic scheduled for 01/08/2022.   Of note, did discuss that a lot of CHF medications would be contraindicated if she was pregnant. However, she assures me that she has an IUD.   Atypical Chest Pain Patient reports very atypical chest pain that she describes as a ping sensation or feeling that her heart is beating heart for a couple of seconds at a time. EKG shows no acute ischemic changes. High-sensitivity troponin negative x2. - Does not sound like angina. Has resolved with diuresis. No need for ischemic evaluation at this time.    Hypertension BP initially 171/88 on arrival but has improved significantly with above therapy and is now soft at times. - Continue medications for CHF as above.    For questions or updates, please contact [facility] Please consult [email or web address] for contact info under        Signed, Corrin Parker, PA-C  12/27/2021, 9:08 AM    ADDENDUM:   Discussed with Dr. Cristal Deer - will transition to Torsemide 20mg  twice daily. She already has follow-up in the [facility].  [facility], PA-C 12/27/2021 10:55 AM

## 2021-12-27 NOTE — Discharge Instructions (Signed)
Heart Failure Education: Weigh yourself EVERY morning after you go to the bathroom but before you eat or drink anything. Write this number down in a weight log/diary. If you gain 3 pounds overnight or 5 pounds in a week, call the office. Take your medicines as prescribed. If you have concerns about your medications, please call us before you stop taking them.  Eat low salt foods--Limit salt (sodium) to 2000 mg per day. This will help prevent your body from holding onto fluid. Read food labels as many processed foods have a lot of sodium, especially canned goods and prepackaged meats. If you would like some assistance choosing low sodium foods, we would be happy to set you up with a nutritionist. Limit all fluids for the day to less than 2 liters (64 ounces). Fluid includes all drinks, coffee, juice, ice chips, soup, jello, and all other liquids. Stay as active as you can everyday. Staying active will give you more energy and make your muscles stronger. Start with 5 minutes at a time and work your way up to 30 minutes a day. Break up your activities--do some in the morning and some in the afternoon. Start with 3 days per week and work your way up to 5 days as you can.  If you have chest pain, feel short of breath, dizzy, or lightheaded, STOP. If you don't feel better after a short rest, call 911. If you do feel better, call the office to let us know you have symptoms with exercise.  

## 2021-12-27 NOTE — Progress Notes (Signed)
Patient to be discharged to home this afternoon. Discharge instructions including all discharge Medications and schedules for these Medications reviewed with the patient. Patient verbalized understanding and discharge AVS with the Patient at time of discharge

## 2021-12-31 ENCOUNTER — Inpatient Hospital Stay: Payer: Self-pay | Admitting: Nurse Practitioner

## 2022-01-07 ENCOUNTER — Telehealth (HOSPITAL_COMMUNITY): Payer: Self-pay | Admitting: *Deleted

## 2022-01-07 NOTE — Telephone Encounter (Signed)
Call attempted to confirm HV TOC appt 9 am on 01/08/22. HIPPA appropriate VM left with callback number.    Rhae Hammock, BSN, Scientist, clinical (histocompatibility and immunogenetics) Only

## 2022-01-07 NOTE — Progress Notes (Signed)
HEART & VASCULAR TRANSITION OF CARE CONSULT NOTE     Referring Physician: Primary Care: Primary Cardiologist:  HPI: Referred to clinic by *** for heart failure consultation. Rebecca Tapia is a 26 y.o. female with history of newly diagnosed HFrEF (10/23), morbid obesity, marijuana use, HTN.  Admitted 10/23 with acute hypoxic respiratory failure in setting of multifocal PNA and new systolic CHF. Echo with EF 35-40%. CM felt to be likely non-ischemic (possibly d/t viral illness). GDMT initiated. She was readmitted on 12/24/21 with a/c CHF. CTA chest with no evidence of PE but did show pulmonary edema, reflux of contrast into IVC and hepatic veins suggesting RV dysfunction, dilated main pulmonary artery. She was diuresed with IV lasix and started on GDMT.   Patient here today for hospital follow-up.    Review of Systems: [y] = yes, [ ]  = no   General: Weight gain [ ] ; Weight loss [ ] ; Anorexia [ ] ; Fatigue [ ] ; Fever [ ] ; Chills [ ] ; Weakness [ ]   Cardiac: Chest pain/pressure [ ] ; Resting SOB [ ] ; Exertional SOB [ ] ; Orthopnea [ ] ; Pedal Edema [ ] ; Palpitations [ ] ; Syncope [ ] ; Presyncope [ ] ; Paroxysmal nocturnal dyspnea[ ]   Pulmonary: Cough [ ] ; Wheezing[ ] ; Hemoptysis[ ] ; Sputum [ ] ; Snoring [ ]   GI: Vomiting[ ] ; Dysphagia[ ] ; Melena[ ] ; Hematochezia [ ] ; Heartburn[ ] ; Abdominal pain [ ] ; Constipation [ ] ; Diarrhea [ ] ; BRBPR [ ]   GU: Hematuria[ ] ; Dysuria [ ] ; Nocturia[ ]   Vascular: Pain in legs with walking [ ] ; Pain in feet with lying flat [ ] ; Non-healing sores [ ] ; Stroke [ ] ; TIA [ ] ; Slurred speech [ ] ;  Neuro: Headaches[ ] ; Vertigo[ ] ; Seizures[ ] ; Paresthesias[ ] ;Blurred vision [ ] ; Diplopia [ ] ; Vision changes [ ]   Ortho/Skin: Arthritis [ ] ; Joint pain [ ] ; Muscle pain [ ] ; Joint swelling [ ] ; Back Pain [ ] ; Rash [ ]   Psych: Depression[ ] ; Anxiety[ ]   Heme: Bleeding problems [ ] ; Clotting disorders [ ] ; Anemia [ ]   Endocrine: Diabetes [ ] ; Thyroid dysfunction[ ]    Past  Medical History:  Diagnosis Date   Morbid obesity (HCC)     Current Outpatient Medications  Medication Sig Dispense Refill   albuterol (PROVENTIL) (2.5 MG/3ML) 0.083% nebulizer solution Inhale 1 vial via nebulizer every 4 hours as needed for wheezing or shortness of breath. 75 mL 12   carvedilol (COREG) 6.25 MG tablet Take 3 tablets (18.75 mg total) by mouth 2 (two) times daily with a meal. 90 tablet 0   levonorgestrel (MIRENA) 20 MCG/24HR IUD 1 each by Intrauterine route once.     losartan (COZAAR) 25 MG tablet Take 1 tablet (25 mg total) by mouth daily. 30 tablet 0   spironolactone (ALDACTONE) 25 MG tablet Take 0.5 tablets (12.5 mg total) by mouth daily. 30 tablet 0   torsemide (DEMADEX) 20 MG tablet Take 1 tablet (20 mg total) by mouth 2 (two) times daily. 30 tablet 0   No current facility-administered medications for this visit.    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  Other Topics Concern   Not on file  Social History Narrative   Not on file   Social Determinants of Health   Financial Resource Strain: High Risk (12/16/2021)   Overall Financial Resource Strain (CARDIA)    Difficulty of Paying Living Expenses: Hard  Food Insecurity: No Food Insecurity (12/24/2021)   Hunger Vital Sign    Worried About Running Out of Food in  the Last Year: Never true    Ran Out of Food in the Last Year: Never true  Transportation Needs: No Transportation Needs (12/24/2021)   PRAPARE - Administrator, Civil Service (Medical): No    Lack of Transportation (Non-Medical): No  Physical Activity: Not on file  Stress: Not on file  Social Connections: Not on file  Intimate Partner Violence: Not At Risk (12/24/2021)   Humiliation, Afraid, Rape, and Kick questionnaire    Fear of Current or Ex-Partner: No    Emotionally Abused: No    Physically Abused: No    Sexually Abused: No      Family History  Problem Relation Age of Onset   Hypertension Father    Heart disease Father     There were no vitals filed for this visit.  PHYSICAL EXAM: General:  Well appearing. No respiratory difficulty HEENT: normal Neck: supple. no JVD. Carotids 2+ bilat; no bruits. No lymphadenopathy or thryomegaly appreciated. Cor: PMI nondisplaced. Regular rate & rhythm. No rubs, gallops or murmurs. Lungs: clear Abdomen: soft, nontender, nondistended. No hepatosplenomegaly. No bruits or masses. Good bowel sounds. Extremities: no cyanosis, clubbing, rash, edema Neuro: alert & oriented x 3, cranial nerves grossly intact. moves all 4 extremities w/o difficulty. Affect pleasant.  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 Doubt ischemic at her age. Would consider cMRI, but her body habitus may be prohibitive. -? Untreated OSA/OHS. Recommend sleep study. Needs significant weight loss. -NYHA ***. Volume *** -Has an IUD ***  2. Super morbid obesity:  3. HTN:   NYHA *** GDMT  Diuretic- BB- Ace/ARB/ARNI MRA SGLT2i    Referred to HFSW (PCP, Medications, Transportation, ETOH Abuse, Drug Abuse, Insurance, Financial ): Yes or No Refer to Pharmacy: Yes or No Refer to Home Health: Yes on No Refer to Advanced Heart Failure Clinic: Yes or no  Refer to General  Cardiology: Yes or No  Follow up

## 2022-01-08 ENCOUNTER — Ambulatory Visit (HOSPITAL_COMMUNITY)
Admit: 2022-01-08 | Discharge: 2022-01-08 | Disposition: A | Discharge status: Home / Self Care | Payer: Self-pay | Source: Ambulatory Visit | Attending: Physician Assistant | Admitting: Physician Assistant

## 2022-01-08 ENCOUNTER — Inpatient Hospital Stay: Payer: Self-pay | Admitting: Nurse Practitioner

## 2022-01-08 ENCOUNTER — Encounter (HOSPITAL_COMMUNITY): Payer: Self-pay

## 2022-01-08 ENCOUNTER — Telehealth (HOSPITAL_COMMUNITY): Payer: Self-pay

## 2022-01-08 VITALS — BP 120/82 | HR 87 | Wt >= 6400 oz

## 2022-01-08 DIAGNOSIS — Z79899 Other long term (current) drug therapy: Secondary | ICD-10-CM | POA: Insufficient documentation

## 2022-01-08 DIAGNOSIS — I5020 Unspecified systolic (congestive) heart failure: Secondary | ICD-10-CM | POA: Insufficient documentation

## 2022-01-08 DIAGNOSIS — I110 Hypertensive heart disease with heart failure: Secondary | ICD-10-CM | POA: Insufficient documentation

## 2022-01-08 DIAGNOSIS — Z6841 Body Mass Index (BMI) 40.0 and over, adult: Secondary | ICD-10-CM | POA: Insufficient documentation

## 2022-01-08 DIAGNOSIS — I10 Essential (primary) hypertension: Secondary | ICD-10-CM

## 2022-01-08 DIAGNOSIS — F1090 Alcohol use, unspecified, uncomplicated: Secondary | ICD-10-CM | POA: Insufficient documentation

## 2022-01-08 LAB — BASIC METABOLIC PANEL
Anion gap: 6 (ref 5–15)
BUN: 9 mg/dL (ref 6–20)
CO2: 28 mmol/L (ref 22–32)
Calcium: 9.1 mg/dL (ref 8.9–10.3)
Chloride: 104 mmol/L (ref 98–111)
Creatinine, Ser: 0.92 mg/dL (ref 0.44–1.00)
GFR, Estimated: >60 mL/min (ref >60)
Glucose, Bld: 91 mg/dL (ref 70–99)
Potassium: 3.9 mmol/L (ref 3.5–5.1)
Sodium: 138 mmol/L (ref 135–145)

## 2022-01-08 LAB — BRAIN NATRIURETIC PEPTIDE: B Natriuretic Peptide: 184.8 pg/mL — ABNORMAL HIGH (ref 0.0–100.0)

## 2022-01-08 MED ORDER — TORSEMIDE 20 MG PO TABS: 20.0000 mg | ORAL_TABLET | Freq: Every day | ORAL | 0 refills | Status: DC

## 2022-01-08 MED ORDER — ENTRESTO 24-26 MG PO TABS: 1.0000 | ORAL_TABLET | Freq: Two times a day (BID) | ORAL | 11 refills | Status: DC

## 2022-01-08 NOTE — Addendum Note (Signed)
Encounter addended by: Andrey Farmer, PA-C on: 01/08/2022 10:31 AM  Actions taken: Order Reconciliation Section accessed, Order list changed

## 2022-01-08 NOTE — Patient Instructions (Addendum)
EKG done today.  Labs done today. We will contact you only if your labs are abnormal.  STOP Losartan  START Entresto 24-26mg  (1 tablet) by mouth 2 times daily.   DECREASE Torsemide to 20mg  (1 tablet) by mouth daily. May take 1 additional tablet if needed.   No other medication changes were made. Please continue all current medications as prescribed.  Your physician recommends that you return for lab work in: 1 week  Thank you for allowing to provide your heart failure care after your recent hospitalization. Please follow-up with General Cardiology at [facility] in 4 weeks    If you have any questions or concerns before your next appointment please send [facility] a message through [place] or call our office at [phone].    TO LEAVE A MESSAGE FOR THE NURSE SELECT OPTION 2, PLEASE LEAVE A MESSAGE INCLUDING: YOUR NAME DATE OF BIRTH CALL BACK NUMBER REASON FOR CALL**this is important as we prioritize the call backs  YOU WILL RECEIVE A CALL BACK THE SAME DAY AS LONG AS YOU CALL BEFORE 4:00 PM

## 2022-01-08 NOTE — Telephone Encounter (Signed)
Advanced Heart Failure Patient Advocate Encounter  Patient is currently uninsured and being started on Entresto, but did confirm that Medicaid has been approved and the ID card should arrive within the next few weeks. Provided 1 month of samples, no assistance submitted at this time, as insurance should be active before next fill is needed.  Medication Samples have been provided to patient directly Drug name: Sherryll Burger 24-26 MG Qty: 2x (28 ct) package LOT: [id number] Exp.: 09/2023 SIG: Take 1 tablet by mouth twice daily   The patient has been instructed regarding the correct time, dose, and frequency of taking this medication, including desired effects and most common side effects.   Burnell Blanks, CPhT Rx Patient Advocate Phone: [phone]

## 2022-01-15 ENCOUNTER — Other Ambulatory Visit (HOSPITAL_COMMUNITY): Payer: Medicaid Other

## 2022-01-16 ENCOUNTER — Ambulatory Visit (HOSPITAL_COMMUNITY)
Admission: RE | Admit: 2022-01-16 | Discharge: 2022-01-16 | Disposition: A | Discharge status: Home / Self Care | Payer: Self-pay | Source: Ambulatory Visit | Attending: Cardiology | Admitting: Cardiology

## 2022-01-16 DIAGNOSIS — I5020 Unspecified systolic (congestive) heart failure: Secondary | ICD-10-CM | POA: Insufficient documentation

## 2022-01-16 LAB — BASIC METABOLIC PANEL
Anion gap: 5 (ref 5–15)
BUN: 9 mg/dL (ref 6–20)
CO2: 25 mmol/L (ref 22–32)
Calcium: 8.7 mg/dL — ABNORMAL LOW (ref 8.9–10.3)
Chloride: 110 mmol/L (ref 98–111)
Creatinine, Ser: 0.9 mg/dL (ref 0.44–1.00)
GFR, Estimated: >60 mL/min (ref >60)
Glucose, Bld: 84 mg/dL (ref 70–99)
Potassium: 3.9 mmol/L (ref 3.5–5.1)
Sodium: 140 mmol/L (ref 135–145)

## 2022-01-17 ENCOUNTER — Other Ambulatory Visit (HOSPITAL_COMMUNITY): Payer: Self-pay

## 2022-01-17 ENCOUNTER — Telehealth (HOSPITAL_COMMUNITY): Payer: Self-pay

## 2022-01-17 NOTE — Telephone Encounter (Signed)
Advanced Heart Failure Patient Advocate Encounter  As of 01/17/2022, this patient has active Medicaid coverage and this will effect the patients needs for medication assistance.  Coverage information has been added to [facility].  Stephaine H, CPhT Rx Patient Advocate Phone: [phone] 

## 2022-01-20 ENCOUNTER — Encounter: Payer: Self-pay | Admitting: Cardiology

## 2022-01-20 ENCOUNTER — Ambulatory Visit: Payer: Medicaid Other | Attending: Cardiology | Admitting: Cardiology

## 2022-01-20 VITALS — BP 120/78 | HR 87 | Ht 72.0 in | Wt >= 6400 oz

## 2022-01-20 DIAGNOSIS — Z6841 Body Mass Index (BMI) 40.0 and over, adult: Secondary | ICD-10-CM

## 2022-01-20 DIAGNOSIS — I428 Other cardiomyopathies: Secondary | ICD-10-CM | POA: Insufficient documentation

## 2022-01-20 DIAGNOSIS — I10 Essential (primary) hypertension: Secondary | ICD-10-CM | POA: Diagnosis not present

## 2022-01-20 DIAGNOSIS — I5020 Unspecified systolic (congestive) heart failure: Secondary | ICD-10-CM | POA: Diagnosis not present

## 2022-01-20 MED ORDER — EMPAGLIFLOZIN 10 MG PO TABS: 10.0000 mg | ORAL_TABLET | Freq: Every day | ORAL | 3 refills | Status: DC

## 2022-01-20 NOTE — Progress Notes (Signed)
Cardiology Office Note:    Date:  01/20/2022   ID:  Rebecca Tapia, DOB 30-Oct-1995, MRN [id number]  PCP:  Patient, No Pcp Per  Cardiologist:  Berniece Salines, DO  Electrophysiologist:  None   Referring MD: Joette Catching, *   " I am ok   History of Present Illness:    Rebecca Tapia is a 26 y.o. female with a hx of recently diagnosed heart failure with reduced ejection fraction at which time her EF was 61 to 40%, morbid obesity, marijuana use/alcohol abuse, hypertension here today for follow-up visit.  I met the patient in October 2023 at that time she was admitted to [facility] for shortness of breath she was noted to be in heart failure with reduced ejection fraction.  During the hospitalization we started the patient on, and directed medical therapy.  Suspected that her nonischemic cardiomyopathy may be due to viral illness but there was report of her father dying of heart failure at a young age.  Since her hospitalization she has seen the transition of care clinic.  Her losartan.  She was transition to [employer].  She tells me that she had to take additional dose of her torsemide recently given some leg swelling.  No hospitalizations since October.   Past Medical History:  Diagnosis Date   Morbid obesity ([place])     Past Surgical History:  Procedure Laterality Date   WISDOM TOOTH EXTRACTION      Current Medications: Current Meds  Medication Sig   carvedilol (COREG) 6.25 MG tablet Take 3 tablets (18.75 mg total) by mouth 2 (two) times daily with a meal.   COD LIVER OIL PO Take by mouth.   levonorgestrel (MIRENA) 20 MCG/24HR IUD 1 each by Intrauterine route once.   Multiple Vitamins-Calcium (ONE-A-DAY WOMENS FORMULA) TABS Take by mouth.   Multiple Vitamins-Minerals (HAIR SKIN AND NAILS FORMULA PO) Take by mouth.   sacubitril-valsartan (ENTRESTO) 24-26 MG Take 1 tablet by mouth 2 (two) times daily. D/C Losartan   spironolactone (ALDACTONE) 25 MG tablet Take  0.5 tablets (12.5 mg total) by mouth daily.   torsemide (DEMADEX) 20 MG tablet Take 1 tablet (20 mg total) by mouth daily. 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  Other Topics Concern   Not on file  Social History Narrative   Not on file   Social Determinants of Health   Financial Resource Strain: High Risk (12/16/2021)   Overall Financial Resource Strain (CARDIA)    Difficulty of Paying Living Expenses: Hard  Food Insecurity: No Food Insecurity (12/24/2021)   Hunger Vital Sign    Worried About Running Out  of Food in the Last Year: Never true    [place] in the Last Year: Never true  Transportation Needs: No Transportation Needs (12/24/2021)   PRAPARE - Hydrologist (Medical): No    Lack of Transportation (Non-Medical): No  Physical Activity: Not on file  Stress: Not on file  Social Connections: Not on file     Family History: The patient's family history includes Heart disease in her father; Hypertension in her father.  ROS:   Review of Systems  Constitution: Negative for decreased appetite, fever and weight gain.  HENT: Negative for congestion, ear discharge, hoarse voice and sore throat.   Eyes: Negative for discharge, redness, vision loss in right eye and visual halos.   Cardiovascular: Negative for chest pain, dyspnea on exertion, leg swelling, orthopnea and palpitations.  Respiratory: Negative for cough, hemoptysis, shortness of breath and snoring.   Endocrine: Negative for heat intolerance and polyphagia.  Hematologic/Lymphatic: Negative for bleeding problem. Does not bruise/bleed easily.  Skin: Negative for flushing, nail changes, rash and suspicious lesions.  Musculoskeletal: Negative for arthritis, joint pain, muscle cramps, myalgias, neck pain and stiffness.  Gastrointestinal: Negative for abdominal pain, bowel incontinence, diarrhea and excessive appetite.  Genitourinary: Negative for decreased libido, genital sores and incomplete emptying.  Neurological: Negative for brief paralysis, focal weakness, headaches and loss of balance.  Psychiatric/Behavioral: Negative for altered mental status, depression and suicidal ideas.  Allergic/Immunologic: Negative for HIV exposure and persistent infections.    EKGs/Labs/Other Studies Reviewed:    The following studies were reviewed today:   EKG: None today  Transthoracic echocardiogram December 15, 2021 IMPRESSIONS     1. Left ventricular ejection fraction, by estimation, is 35 to 40%. The  left ventricle has moderately decreased function. The left ventricle  demonstrates global hypokinesis. The left ventricular internal cavity size  was moderately dilated. Indeterminate   diastolic filling due to E-A fusion.   2. Right ventricular systolic function is normal. The right ventricular  size is normal.   3. Left atrial size was moderately dilated.   4. The mitral valve is normal in structure. Trivial mitral valve  regurgitation. No evidence of mitral stenosis.   5. The aortic valve is tricuspid. Aortic valve regurgitation is not  visualized. No aortic stenosis is present.   6. The inferior vena cava is normal in size with greater than 50%  respiratory variability, suggesting right atrial pressure of 3  mmHg.   FINDINGS   Left Ventricle: Left ventricular ejection fraction, by estimation, is 35  to 40%. The left ventricle has moderately decreased function. The left  ventricle demonstrates global hypokinesis. Definity contrast agent was  given IV to delineate the left  ventricular endocardial borders. The left ventricular internal cavity size  was moderately dilated. There is no left ventricular hypertrophy.  Indeterminate diastolic filling due to E-A fusion.   Right Ventricle: The right ventricular size is normal. No increase in  right ventricular wall thickness. Right ventricular systolic function is  normal.   Left Atrium: Left atrial size was moderately dilated.   Right Atrium: Right atrial size was normal in size.   Pericardium: There is no evidence of pericardial effusion.   Mitral Valve: The mitral valve is normal in structure. Trivial mitral  valve regurgitation. No evidence of mitral valve stenosis.   Tricuspid Valve: The tricuspid valve is normal in structure. Tricuspid  valve regurgitation is trivial. No evidence of tricuspid stenosis.   Aortic Valve: The aortic  valve is tricuspid. Aortic valve regurgitation is  not visualized. No aortic stenosis is present.   Pulmonic Valve: The pulmonic valve was normal in structure. Pulmonic valve  regurgitation is mild. No evidence of pulmonic stenosis.   Aorta: The aortic root is normal in size and structure.   Venous: The inferior vena cava is normal in size with greater than 50%  respiratory variability, suggesting right atrial pressure of 3 mmHg.   IAS/Shunts: The interatrial septum was not well visualized.       Recent Labs: 12/15/2021: Magnesium 2.2 12/27/2021: ALT 56; Hemoglobin 13.8; Platelets 313 01/08/2022: B Natriuretic Peptide 184.8 01/16/2022: BUN 9; Creatinine, Ser 0.90; Potassium 3.9; Sodium 140  Recent Lipid Panel No results found for: "CHOL", "TRIG", "HDL", "CHOLHDL", "VLDL", "LDLCALC",  "LDLDIRECT"  Physical Exam:    VS:  BP 120/78   Pulse 87   Ht 6' (1.829 m)   Wt (!) 424 lb (192.3 kg)   SpO2 98%   BMI 57.50 kg/m     Wt Readings from Last 3 Encounters:  01/20/22 (!) 424 lb (192.3 kg)  01/08/22 (!) 421 lb (191 kg)  12/27/21 (!) 425 lb 4.3 oz (192.9 kg)     GEN: Well nourished, well developed in no acute distress HEENT: Normal NECK: No JVD; No carotid bruits LYMPHATICS: No lymphadenopathy CARDIAC: S1S2 noted,RRR, no murmurs, rubs, gallops RESPIRATORY:  Clear to auscultation without rales, wheezing or rhonchi  ABDOMEN: Soft, non-tender, non-distended, +bowel sounds, no guarding. EXTREMITIES: No edema, No cyanosis, no clubbing MUSCULOSKELETAL:  No deformity  SKIN: Warm and dry NEUROLOGIC:  Alert and oriented x 3, non-focal PSYCHIATRIC:  Normal affect, good insight  ASSESSMENT:    1. HFrEF (heart failure with reduced ejection fraction) ([place])   2. Morbid obesity with BMI of 50.0-59.9, adult ([place])   3. Primary hypertension   4. Nonischemic cardiomyopathy ([place])    PLAN:     1.  Heart failure with reduced ejection fraction-she is currently on Entresto, Coreg, spironolactone and torsemide.  She is tolerating these medications very well.  I like to try the patient on SGLT2 inhibitors.  We will start Jardiance today.  2.  We will schedule her echocardiogram in January to assess her LV function.  She will see me couple days after that.  3.  We talked about maintaining lifestyle modification as well.  She has transform her diet.  4.  The patient understands the need to lose weight with diet and exercise. We have discussed specific strategies for this.  The patient is in agreement with the above plan. The patient left the office in stable condition.  The patient will follow up in after her echo in January.   Medication Adjustments/Labs and Tests Ordered: Current medicines are reviewed at length with the patient today.  Concerns regarding medicines are outlined  above.  No orders of the defined types were placed in this encounter.  No orders of the defined types were placed in this encounter.   There are no Patient Instructions on file for this visit.   Adopting a Healthy Lifestyle.  Know what a healthy weight is for you (roughly BMI <25) and aim to maintain this   Aim for 7+ servings of fruits and vegetables daily   65-80+ fluid ounces of water or unsweet tea for healthy kidneys   Limit to max 1 drink of alcohol per day; avoid smoking/tobacco   Limit animal fats in diet for cholesterol and heart health - choose grass fed whenever available  Avoid highly processed foods, and foods high in saturated/trans fats   Aim for low stress - take time to unwind and care for your mental health   Aim for 150 min of moderate intensity exercise weekly for heart health, and weights twice weekly for bone health   Aim for 7-9 hours of sleep daily   When it comes to diets, agreement about the perfect plan isnt easy to find, even among the experts. Experts at the [employer] [place] developed an idea known as the Healthy Eating Plate. Just imagine a plate divided into logical, healthy portions.   The emphasis is on diet quality:   Load up on vegetables and fruits - one-half of your plate: Aim for color and variety, and remember that potatoes dont count.   Go for whole grains - one-quarter of your plate: Whole wheat, barley, wheat berries, quinoa, oats, brown rice, and foods made with them. If you want pasta, go with whole wheat pasta.   Protein power - one-quarter of your plate: Fish, chicken, beans, and nuts are all healthy, versatile protein sources. Limit red meat.   The diet, however, does go beyond the plate, offering a few other suggestions.   Use healthy plant oils, such as olive, canola, soy, corn, sunflower and peanut. Check the labels, and avoid partially hydrogenated oil, which have unhealthy trans fats.   If youre thirsty,  drink water. Coffee and tea are good in moderation, but skip sugary drinks and limit milk and dairy products to one or two daily servings.   The type of carbohydrate in the diet is more important than the amount. Some sources of carbohydrates, such as vegetables, fruits, whole grains, and beans-are healthier than others.   Finally, stay active  Signed, Berniece Salines, DO  01/20/2022 9:41 AM    [place]

## 2022-01-20 NOTE — Patient Instructions (Signed)
Medication Instructions:  Your physician has recommended you make the following change in your medication:  START Jardiance 10 mg by mouth ONCE daily BEFORE breakfast  *If you need a refill on your cardiac medications before your next appointment, please call your pharmacy*   Lab Work: Your physician recommends that you return for lab work in: TODAY  If you have labs (blood work) drawn today and your tests are completely normal, you will receive your results only by: MyChart Message (if you have MyChart) OR A paper copy in the mail If you have any lab test that is abnormal or we need to change your treatment, we will call you to review the results.   Testing/Procedures: Your physician has requested that you have an echocardiogram. Echocardiography is a painless test that uses sound waves to create images of your heart. It provides your doctor with information about the size and shape of your heart and how well your heart's chambers and valves are working. This procedure takes approximately one hour. There are no restrictions for this procedure. Please do NOT wear cologne, perfume, aftershave, or lotions (deodorant is allowed). Please arrive 15 minutes prior to your appointment time.  SCHEDULE AFTER March 17, 2022   Follow-Up: At [facility], you and your health needs are our priority.  As part of our continuing mission to provide you with exceptional heart care, we have created designated Provider Care Teams.  These Care Teams include your primary Cardiologist (physician) and Advanced Practice Providers (APPs -  Physician Assistants and Nurse Practitioners) who all work together to provide you with the care you need, when you need it.  We recommend signing up for the patient portal called "MyChart".  Sign up information is provided on this After Visit Summary.  MyChart is used to connect with patients for Virtual Visits (Telemedicine).  Patients are able to view lab/test  results, encounter notes, upcoming appointments, etc.  Non-urgent messages can be sent to your provider as well.   To learn more about what you can do with MyChart, go to [email or web address].    Your next appointment:    Scheduled for 04/14/2022 @ 3:20AM  The format for your next appointment:   In Person  Provider:   Thomasene Ripple, DO     Other Instructions   Important Information About Sugar

## 2022-01-24 ENCOUNTER — Other Ambulatory Visit (HOSPITAL_COMMUNITY): Payer: Self-pay

## 2022-01-24 NOTE — Telephone Encounter (Signed)
Advanced Heart Failure Patient Advocate Encounter  Patient is now covered under a Medicaid plan. Added to [facility], test claim returns $3 copay. Assistance not needed for this medication at this time.

## 2022-01-28 LAB — COMPREHENSIVE METABOLIC PANEL
ALT: 23 IU/L (ref 0–32)
AST: 15 IU/L (ref 0–40)
Albumin/Globulin Ratio: 1.4 (ref 1.2–2.2)
Albumin: 4.1 g/dL (ref 4.0–5.0)
Alkaline Phosphatase: 91 IU/L (ref 44–121)
BUN/Creatinine Ratio: 16 (ref 9–23)
BUN: 15 mg/dL (ref 6–20)
Bilirubin Total: 0.3 mg/dL (ref 0.0–1.2)
CO2: 24 mmol/L (ref 20–29)
Calcium: 9.6 mg/dL (ref 8.7–10.2)
Chloride: 108 mmol/L — ABNORMAL HIGH (ref 96–106)
Creatinine, Ser: 0.93 mg/dL (ref 0.57–1.00)
Globulin, Total: 3 g/dL (ref 1.5–4.5)
Glucose: 88 mg/dL (ref 70–99)
Potassium: 4.9 mmol/L (ref 3.5–5.2)
Sodium: 151 mmol/L — ABNORMAL HIGH (ref 134–144)
Total Protein: 7.1 g/dL (ref 6.0–8.5)
eGFR: 87 mL/min/{1.73_m2} (ref >59)

## 2022-01-28 LAB — MAGNESIUM: Magnesium: 2 mg/dL (ref 1.6–2.3)

## 2022-02-07 ENCOUNTER — Telehealth: Payer: Self-pay | Admitting: Cardiology

## 2022-02-07 NOTE — Telephone Encounter (Signed)
 *  STAT* If patient is at the pharmacy, call can be transferred to refill team.   1. Which medications need to be refilled? (please list name of each medication and dose if known) carvedilol (COREG) 6.25 MG tablet   2. Which pharmacy/location (including street and city if local pharmacy) is medication to be sent to? [id number] - [place], [place] - [address]   3. Do they need a 30 day or 90 day supply? 90 days

## 2022-02-11 MED ORDER — CARVEDILOL 6.25 MG PO TABS: 18.7500 mg | ORAL_TABLET | Freq: Two times a day (BID) | ORAL | 3 refills | Status: DC

## 2022-02-26 ENCOUNTER — Other Ambulatory Visit: Payer: Self-pay

## 2022-02-27 MED ORDER — SPIRONOLACTONE 25 MG PO TABS: 12.5000 mg | ORAL_TABLET | Freq: Every day | ORAL | 0 refills | Status: DC

## 2022-03-03 ENCOUNTER — Telehealth: Payer: Self-pay

## 2022-03-03 NOTE — Telephone Encounter (Signed)
Mychart msg sent. AS, CMA 

## 2022-03-17 ENCOUNTER — Other Ambulatory Visit (HOSPITAL_COMMUNITY): Payer: Self-pay

## 2022-03-17 ENCOUNTER — Telehealth (HOSPITAL_COMMUNITY): Payer: Self-pay

## 2022-03-17 ENCOUNTER — Ambulatory Visit (HOSPITAL_COMMUNITY): Payer: Medicaid Other | Attending: Cardiovascular Disease

## 2022-03-17 DIAGNOSIS — Z6841 Body Mass Index (BMI) 40.0 and over, adult: Secondary | ICD-10-CM | POA: Insufficient documentation

## 2022-03-17 DIAGNOSIS — I10 Essential (primary) hypertension: Secondary | ICD-10-CM | POA: Insufficient documentation

## 2022-03-17 DIAGNOSIS — I5020 Unspecified systolic (congestive) heart failure: Secondary | ICD-10-CM | POA: Insufficient documentation

## 2022-03-17 DIAGNOSIS — I428 Other cardiomyopathies: Secondary | ICD-10-CM | POA: Diagnosis present

## 2022-03-17 LAB — ECHOCARDIOGRAM COMPLETE
Area-P 1/2: 5.38 cm2
MV M vel: 4.81 m/s
MV Peak grad: 92.5 mmHg
S' Lateral: 5.9 cm

## 2022-03-17 MED ORDER — PERFLUTREN LIPID MICROSPHERE
1.0000 mL | INTRAVENOUS | Status: AC | PRN
  Administered 2022-03-17: 3 mL via INTRAVENOUS

## 2022-03-17 NOTE — Telephone Encounter (Signed)
Advanced Heart Failure Patient Advocate Encounter  Prior authorization is required for Entresto. PA submitted and APPROVED on 03/17/22.  Key [id number] Effective: 03/17/22 - 03/18/23  Clista Bernhardt, CPhT Rx Patient Advocate Phone: [phone]

## 2022-04-02 ENCOUNTER — Other Ambulatory Visit: Payer: Self-pay

## 2022-04-02 MED ORDER — SACUBITRIL-VALSARTAN 49-51 MG PO TABS: 1.0000 | ORAL_TABLET | Freq: Two times a day (BID) | ORAL | 3 refills | Status: DC

## 2022-04-02 MED ORDER — TORSEMIDE 20 MG PO TABS: 20.0000 mg | ORAL_TABLET | Freq: Every day | ORAL | 3 refills | Status: DC

## 2022-04-02 MED ORDER — CARVEDILOL 6.25 MG PO TABS: 18.7500 mg | ORAL_TABLET | Freq: Two times a day (BID) | ORAL | 4 refills | Status: DC

## 2022-04-02 NOTE — Progress Notes (Signed)
Spoke with patient about her results and Dr. Terrial Rhodes recommendations. Patient verbalizes understanding. Pt requested refills on her medications. No further questions or concerns at this time. Prescription sent to pharmacy.

## 2022-04-14 ENCOUNTER — Ambulatory Visit: Payer: Medicaid Other | Attending: Cardiology | Admitting: Cardiology

## 2022-06-17 ENCOUNTER — Telehealth: Payer: Self-pay

## 2022-06-17 ENCOUNTER — Other Ambulatory Visit (HOSPITAL_COMMUNITY): Payer: Self-pay

## 2022-06-17 ENCOUNTER — Telehealth: Payer: Self-pay | Admitting: Cardiology

## 2022-06-17 NOTE — Telephone Encounter (Signed)
Pharmacy Patient Advocate Encounter   Received notification from [facility] MEDICAID that prior authorization for JARDIANCE is needed.    PA submitted on 06/17/22 Key [id number] Status is pending  Haze Rushing, CPhT Pharmacy Patient Advocate Specialist Direct Number: [phone] Fax: [phone]

## 2022-06-17 NOTE — Telephone Encounter (Signed)
Pt c/o medication issue:  1. Name of Medication:   empagliflozin (JARDIANCE) 10 MG TABS tablet    2. How are you currently taking this medication (dosage and times per day)?   Take 1 tablet (10 mg total) by mouth daily before breakfast.    3. Are you having a reaction (difficulty breathing--STAT)? No   4. What is your medication issue? Pt called stating she's unable to get this medication due to her needing a PA and she'd like a callback on how to start the process.   Pt c/o medication issue:  1. Name of Medication: spironolactone (ALDACTONE) 25 MG tablet   2. How are you currently taking this medication (dosage and times per day)? Take 0.5 tablets (12.5 mg total) by mouth daily.   3. Are you having a reaction (difficulty breathing--STAT)? No  4. What is your medication issue? Pt stated that she was advise by pharmacy that she shouldn't take this above medication while also taking sacubitril-valsartan (ENTRESTO) 49-51 MG and pt would like to know what she should do. Please advise

## 2022-06-17 NOTE — Telephone Encounter (Signed)
PA team - please submit a prior auth for Jardiance for CHF indication. Rebecca Tapia - pt should take spironolactone with Entresto, they are both level 1 meds to be used in patients with CHF. Not sure why her pharmacy told her that. She does need BMET while on both so not sure if they were thinking of that.

## 2022-06-17 NOTE — Telephone Encounter (Signed)
Jardiance PA also approved so pharmacy should be able to fill med now for her.

## 2022-06-17 NOTE — Telephone Encounter (Signed)
Mailbox is full-unable to leave voicemail

## 2022-06-17 NOTE — Telephone Encounter (Signed)
Unable to leave voicemail.Mailbox full forwarded to PA team

## 2022-06-17 NOTE — Telephone Encounter (Signed)
Unable to leave voicemail. Mailbox full

## 2022-06-17 NOTE — Telephone Encounter (Signed)
Noted, documented in other encounter.

## 2022-06-17 NOTE — Telephone Encounter (Signed)
Pharmacy Patient Advocate Encounter  Prior Authorization for [name] has been approved.    PA# [id number] Effective dates: 06/17/22 through 06/17/23  Haze Rushing, CPhT Pharmacy Patient Advocate Specialist Direct Number: [phone] Fax: [phone]

## 2023-02-19 ENCOUNTER — Other Ambulatory Visit: Payer: Self-pay | Admitting: Cardiology

## 2023-03-14 ENCOUNTER — Other Ambulatory Visit: Payer: Self-pay | Admitting: Cardiology

## 2023-03-26 ENCOUNTER — Other Ambulatory Visit: Payer: Self-pay | Admitting: Cardiology

## 2023-05-06 ENCOUNTER — Other Ambulatory Visit: Payer: Self-pay | Admitting: Cardiology

## 2023-05-22 ENCOUNTER — Other Ambulatory Visit: Payer: Self-pay | Admitting: Cardiology

## 2023-05-22 DIAGNOSIS — I10 Essential (primary) hypertension: Secondary | ICD-10-CM

## 2023-05-22 DIAGNOSIS — I5020 Unspecified systolic (congestive) heart failure: Secondary | ICD-10-CM

## 2023-05-22 DIAGNOSIS — I428 Other cardiomyopathies: Secondary | ICD-10-CM

## 2023-05-22 MED ORDER — SPIRONOLACTONE 25 MG PO TABS: 12.5000 mg | ORAL_TABLET | Freq: Every day | ORAL | 0 refills | Status: AC

## 2023-05-22 MED ORDER — SACUBITRIL-VALSARTAN 49-51 MG PO TABS: 1.0000 | ORAL_TABLET | Freq: Two times a day (BID) | ORAL | 0 refills | Status: AC

## 2023-05-22 MED ORDER — EMPAGLIFLOZIN 10 MG PO TABS: 10.0000 mg | ORAL_TABLET | Freq: Every day | ORAL | 0 refills | Status: AC

## 2023-05-22 MED ORDER — CARVEDILOL 6.25 MG PO TABS: 18.7500 mg | ORAL_TABLET | Freq: Two times a day (BID) | ORAL | 0 refills | Status: AC

## 2023-06-05 ENCOUNTER — Other Ambulatory Visit: Payer: Self-pay | Admitting: Cardiology

## 2023-07-23 ENCOUNTER — Other Ambulatory Visit: Payer: Self-pay | Admitting: Cardiology

## 2023-07-23 ENCOUNTER — Telehealth: Payer: Self-pay | Admitting: Pharmacy Technician

## 2023-07-23 NOTE — Telephone Encounter (Signed)
 Pharmacy Patient Advocate Encounter  Received notification from [facility] MEDICAID that Prior Authorization for JARDIANCE  10MG  has been APPROVED from 07/23/23 to 07/22/24. Spoke to pharmacy to process.Copay is $4.00.    PA #/Case ID/Reference #: [id number]

## 2023-07-23 NOTE — Telephone Encounter (Signed)
 Pharmacy Patient Advocate Encounter   Received notification from CoverMyMeds that prior authorization for JARDIANCE  is required/requested.   Insurance verification completed.   The patient is insured through [facility] MEDICAID .   Per test claim: PA required; PA submitted to above mentioned insurance via CoverMyMeds Key/confirmation #/EOC [id number] Status is pending

## 2023-08-20 ENCOUNTER — Other Ambulatory Visit: Payer: Self-pay | Admitting: Cardiology

## 2023-12-26 DIAGNOSIS — J90 Pleural effusion, not elsewhere classified: Secondary | ICD-10-CM | POA: Diagnosis not present

## 2023-12-26 DIAGNOSIS — J811 Chronic pulmonary edema: Secondary | ICD-10-CM | POA: Diagnosis not present

## 2023-12-26 DIAGNOSIS — R0602 Shortness of breath: Secondary | ICD-10-CM | POA: Diagnosis not present

## 2023-12-26 DIAGNOSIS — I517 Cardiomegaly: Secondary | ICD-10-CM | POA: Diagnosis not present

## 2023-12-28 DIAGNOSIS — I509 Heart failure, unspecified: Secondary | ICD-10-CM | POA: Diagnosis not present

## 2023-12-29 DIAGNOSIS — Z515 Encounter for palliative care: Secondary | ICD-10-CM | POA: Diagnosis not present

## 2023-12-29 DIAGNOSIS — R0600 Dyspnea, unspecified: Secondary | ICD-10-CM | POA: Diagnosis not present

## 2023-12-29 DIAGNOSIS — I509 Heart failure, unspecified: Secondary | ICD-10-CM | POA: Diagnosis not present

## 2023-12-29 DIAGNOSIS — Z7189 Other specified counseling: Secondary | ICD-10-CM | POA: Diagnosis not present
